# Patient Record
Sex: Male | Born: 1980 | Race: Black or African American | Hispanic: No | Marital: Single | Smoking: Never smoker
Health system: Southern US, Community
[De-identification: ages and names within clinical notes are randomized; demographics above are authoritative.]

## PROBLEM LIST (undated history)

## (undated) DIAGNOSIS — G43909 Migraine, unspecified, not intractable, without status migrainosus: Secondary | ICD-10-CM

## (undated) HISTORY — DX: Migraine, unspecified, not intractable, without status migrainosus: G43.909

## (undated) HISTORY — PX: APPENDECTOMY: SHX54

---

## 2018-12-11 ENCOUNTER — Other Ambulatory Visit: Payer: Self-pay

## 2018-12-11 ENCOUNTER — Emergency Department (HOSPITAL_BASED_OUTPATIENT_CLINIC_OR_DEPARTMENT_OTHER): Payer: 59

## 2018-12-11 ENCOUNTER — Emergency Department (HOSPITAL_BASED_OUTPATIENT_CLINIC_OR_DEPARTMENT_OTHER)
Admission: EM | Admit: 2018-12-11 | Discharge: 2018-12-11 | Disposition: A | Discharge status: Home / Self Care | Payer: 59 | Attending: Emergency Medicine | Admitting: Emergency Medicine

## 2018-12-11 ENCOUNTER — Encounter (HOSPITAL_BASED_OUTPATIENT_CLINIC_OR_DEPARTMENT_OTHER): Payer: Self-pay | Admitting: Emergency Medicine

## 2018-12-11 DIAGNOSIS — R002 Palpitations: Secondary | ICD-10-CM | POA: Insufficient documentation

## 2018-12-11 LAB — CBC WITH DIFFERENTIAL/PLATELET
Abs Immature Granulocytes: 0.01 10*3/uL (ref 0.00–0.07)
Basophils Absolute: 0 10*3/uL (ref 0.0–0.1)
Basophils Relative: 1 %
Eosinophils Absolute: 0.2 10*3/uL (ref 0.0–0.5)
Eosinophils Relative: 4 %
HCT: 48.3 % (ref 39.0–52.0)
Hemoglobin: 15.6 g/dL (ref 13.0–17.0)
Immature Granulocytes: 0 %
Lymphocytes Relative: 41 %
Lymphs Abs: 2.1 10*3/uL (ref 0.7–4.0)
MCH: 29.5 pg (ref 26.0–34.0)
MCHC: 32.3 g/dL (ref 30.0–36.0)
MCV: 91.3 fL (ref 80.0–100.0)
MONO ABS: 0.7 10*3/uL (ref 0.1–1.0)
Monocytes Relative: 13 %
Neutro Abs: 2.1 10*3/uL (ref 1.7–7.7)
Neutrophils Relative %: 41 %
Platelets: 195 10*3/uL (ref 150–400)
RBC: 5.29 MIL/uL (ref 4.22–5.81)
RDW: 12.3 % (ref 11.5–15.5)
WBC: 5.2 10*3/uL (ref 4.0–10.5)
nRBC: 0 % (ref 0.0–0.2)

## 2018-12-11 LAB — URINALYSIS, ROUTINE W REFLEX MICROSCOPIC
Bilirubin Urine: NEGATIVE
GLUCOSE, UA: NEGATIVE mg/dL
Hgb urine dipstick: NEGATIVE
KETONES UR: NEGATIVE mg/dL
Leukocytes, UA: NEGATIVE
Nitrite: NEGATIVE
Protein, ur: NEGATIVE mg/dL
Specific Gravity, Urine: 1.01 (ref 1.005–1.030)
pH: 7.5 (ref 5.0–8.0)

## 2018-12-11 LAB — COMPREHENSIVE METABOLIC PANEL
ALK PHOS: 91 U/L (ref 38–126)
ALT: 37 U/L (ref 0–44)
AST: 29 U/L (ref 15–41)
Albumin: 4.1 g/dL (ref 3.5–5.0)
Anion gap: 9 (ref 5–15)
BUN: 14 mg/dL (ref 6–20)
CO2: 26 mmol/L (ref 22–32)
Calcium: 9.2 mg/dL (ref 8.9–10.3)
Chloride: 102 mmol/L (ref 98–111)
Creatinine, Ser: 1.41 mg/dL — ABNORMAL HIGH (ref 0.61–1.24)
GFR calc Af Amer: >60 mL/min (ref >60)
GFR calc non Af Amer: >60 mL/min (ref >60)
Glucose, Bld: 120 mg/dL — ABNORMAL HIGH (ref 70–99)
Potassium: 3.9 mmol/L (ref 3.5–5.1)
Sodium: 137 mmol/L (ref 135–145)
Total Bilirubin: 0.5 mg/dL (ref 0.3–1.2)
Total Protein: 7.6 g/dL (ref 6.5–8.1)

## 2018-12-11 LAB — TSH: TSH: 2.349 u[IU]/mL (ref 0.350–4.500)

## 2018-12-11 LAB — RAPID URINE DRUG SCREEN, HOSP PERFORMED
Amphetamines: NOT DETECTED
Barbiturates: NOT DETECTED
Benzodiazepines: NOT DETECTED
Cocaine: NOT DETECTED
Opiates: NOT DETECTED
Tetrahydrocannabinol: NOT DETECTED

## 2018-12-11 LAB — TROPONIN I: Troponin I: <0.03 ng/mL (ref <0.03)

## 2018-12-11 MED ORDER — SODIUM CHLORIDE 0.9 % IV BOLUS
1000.0000 mL | Freq: Once | INTRAVENOUS | Status: AC
  Administered 2018-12-11: 1000 mL via INTRAVENOUS

## 2018-12-11 NOTE — ED Notes (Signed)
Patient transported to X-ray 

## 2018-12-11 NOTE — ED Provider Notes (Signed)
[facility] EMERGENCY DEPARTMENT Provider Note   CSN: [id number] Arrival date & time: 12/11/18  1946     History   Chief Complaint Chief Complaint  Patient presents with  . Palpitations    HPI Shaun Stephens is a 37 y.o. male.  Pt presents to the ED today with palpitations.  Pt said sx started today.  He has never had anything like this in the past.  He said he will occasionally feel like his heart will speed up and then pause.  At one point, he felt a sense like something bad was going to happen to him.  He called his friend to drive him here because he was afraid to drive.  He did drink at a party last night, but he does not feel like he drank too much.  He did have an energy drink today, but was having sx prior to this energy drink.  He has had those drinks in the past with no problems.  He is also seeing a twitching in his chest which looks like his heart beat.  He has been under a lot of stress.     History reviewed. No pertinent past medical history.  There are no active problems to display for this patient.   Past Surgical History:  Procedure Laterality Date  . APPENDECTOMY          Home Medications    Prior to Admission medications   Not on File    Family History History reviewed. No pertinent family history.  Social History Social History   Tobacco Use  . Smoking status: Never Smoker  . Smokeless tobacco: Never Used  Substance Use Topics  . Alcohol use: Yes    Frequency: Never    Comment: 1 time per week  . Drug use: Never     Allergies   Patient has no known allergies.   Review of Systems Review of Systems  Cardiovascular: Positive for palpitations.  All other systems reviewed and are negative.    Physical Exam Updated Vital Signs BP 127/71 (BP Location: Right Arm)   Pulse 88   Temp 98.5 F (36.9 C)   Resp 17   Ht 6\' 1"  (1.854 m)   Wt 83.9 kg   SpO2 98%   BMI 24.41 kg/m   Physical Exam Vitals signs and nursing  note reviewed.  Constitutional:      Appearance: Normal appearance. He is normal weight.  HENT:     Head: Normocephalic and atraumatic.     Right Ear: External ear normal.     Left Ear: External ear normal.     Nose: Nose normal.     Mouth/Throat:     Mouth: Mucous membranes are moist.     Pharynx: Oropharynx is clear.  Eyes:     Extraocular Movements: Extraocular movements intact.     Conjunctiva/sclera: Conjunctivae normal.     Pupils: Pupils are equal, round, and reactive to light.  Neck:     Musculoskeletal: Normal range of motion and neck supple.  Cardiovascular:     Rate and Rhythm: Normal rate and regular rhythm.     Pulses: Normal pulses.     Heart sounds: Normal heart sounds.     Comments: pmi is prominent on chest Pulmonary:     Effort: Pulmonary effort is normal.     Breath sounds: Normal breath sounds.  Abdominal:     General: Abdomen is flat. Bowel sounds are normal.  Musculoskeletal: Normal range of motion.  Skin:    General: Skin is warm and dry.     Capillary Refill: Capillary refill takes less than 2 seconds.  Neurological:     General: No focal deficit present.     Mental Status: He is alert and oriented to person, place, and time.  Psychiatric:        Mood and Affect: Mood normal.        Behavior: Behavior normal.        Thought Content: Thought content normal.        Judgment: Judgment normal.      ED Treatments / Results  Labs (all labs ordered are listed, but only abnormal results are displayed) Labs Reviewed  COMPREHENSIVE METABOLIC PANEL - Abnormal; Notable for the following components:      Result Value   Glucose, Bld 120 (*)    Creatinine, Ser 1.41 (*)    All other components within normal limits  CBC WITH DIFFERENTIAL/PLATELET  TROPONIN I  URINALYSIS, ROUTINE W REFLEX MICROSCOPIC  RAPID URINE DRUG SCREEN, HOSP PERFORMED  TSH    EKG EKG Interpretation  Date/Time:  Saturday December 11 2018 19:56:23 EST Ventricular Rate:  93 PR  Interval:    QRS Duration: 84 QT Interval:  341 QTC Calculation: 425 R Axis:   23 Text Interpretation:  Sinus rhythm Abnormal R-wave progression, early transition ST elevation, consider inferior injury No old tracing to compare Confirmed by Jacalyn LefevreHaviland, Chaunda Vandergriff [phone]([id number]) on 12/11/2018 8:06:03 PM   Radiology Dg Chest 2 View  Result Date: 12/11/2018 CLINICAL DATA:  Palpitation EXAM: CHEST - 2 VIEW COMPARISON:  None. FINDINGS: The heart size and mediastinal contours are within normal limits. Both lungs are clear. The visualized skeletal structures are unremarkable. IMPRESSION: No active cardiopulmonary disease. Electronically Signed   By: Jasmine PangKim  Fujinaga M.D.   On: 12/11/2018 21:01    Procedures Procedures (including critical care time)  Medications Ordered in ED Medications  sodium chloride 0.9 % bolus 1,000 mL (1,000 mLs Intravenous New Bag/Given 12/11/18 2125)     Initial Impression / Assessment and Plan / ED Course  I have reviewed the triage vital signs and the nursing notes.  Pertinent labs & imaging results that were available during my care of the patient were reviewed by me and considered in my medical decision making (see chart for details).    Pt is feeling much better after IVFs.  He has remained in NSR on monitor.  He is instructed to f/u with cardiology.  Avoid caffeine.  Drink lots of water.  Return if worse.  Final Clinical Impressions(s) / ED Diagnoses   Final diagnoses:  Palpitations    ED Discharge Orders    None       Jacalyn LefevreHaviland, Cordarrius Coad, MD 12/11/18 2344

## 2018-12-11 NOTE — ED Triage Notes (Signed)
Patient states that he was at home and felt like his heart sped up and then he had " a bad feeling" come over him . The patient states that this has been intermittent today  - denies any pain

## 2018-12-14 ENCOUNTER — Encounter (HOSPITAL_BASED_OUTPATIENT_CLINIC_OR_DEPARTMENT_OTHER): Payer: Self-pay | Admitting: Adult Health

## 2018-12-14 ENCOUNTER — Emergency Department (HOSPITAL_BASED_OUTPATIENT_CLINIC_OR_DEPARTMENT_OTHER)
Admission: EM | Admit: 2018-12-14 | Discharge: 2018-12-14 | Disposition: A | Discharge status: Home / Self Care | Payer: 59 | Attending: Emergency Medicine | Admitting: Emergency Medicine

## 2018-12-14 ENCOUNTER — Other Ambulatory Visit: Payer: Self-pay

## 2018-12-14 DIAGNOSIS — R0789 Other chest pain: Secondary | ICD-10-CM | POA: Diagnosis not present

## 2018-12-14 DIAGNOSIS — R002 Palpitations: Secondary | ICD-10-CM | POA: Diagnosis not present

## 2018-12-14 LAB — CBC
HCT: 47.8 % (ref 39.0–52.0)
Hemoglobin: 15.7 g/dL (ref 13.0–17.0)
MCH: 29.8 pg (ref 26.0–34.0)
MCHC: 32.8 g/dL (ref 30.0–36.0)
MCV: 90.9 fL (ref 80.0–100.0)
Platelets: 216 10*3/uL (ref 150–400)
RBC: 5.26 MIL/uL (ref 4.22–5.81)
RDW: 12 % (ref 11.5–15.5)
WBC: 6.1 10*3/uL (ref 4.0–10.5)
nRBC: 0 % (ref 0.0–0.2)

## 2018-12-14 LAB — BASIC METABOLIC PANEL
Anion gap: 8 (ref 5–15)
BUN: 12 mg/dL (ref 6–20)
CO2: 26 mmol/L (ref 22–32)
Calcium: 9.5 mg/dL (ref 8.9–10.3)
Chloride: 102 mmol/L (ref 98–111)
Creatinine, Ser: 1.49 mg/dL — ABNORMAL HIGH (ref 0.61–1.24)
GFR calc Af Amer: >60 mL/min (ref >60)
GFR calc non Af Amer: 58 mL/min — ABNORMAL LOW (ref >60)
Glucose, Bld: 103 mg/dL — ABNORMAL HIGH (ref 70–99)
POTASSIUM: 3.9 mmol/L (ref 3.5–5.1)
Sodium: 136 mmol/L (ref 135–145)

## 2018-12-14 LAB — TROPONIN I: Troponin I: <0.03 ng/mL (ref <0.03)

## 2018-12-14 NOTE — ED Triage Notes (Signed)
PResents with chest pain , chest warmth and feeling like "I can't quite describe it but I feels like the lights will go out on me or something bad is going to happen. Like I am going to blak out. It really hard to explain but it has been coming in cycles sine I have been here last Saturday. My chest gets very warm and I feel like my heart is skipping beats and I have to take deep breaths. I am so scared I am having aheart attack or something really bad. My child just diagnosed with an auotimmune disease and she has been going back and forth to docotrs and there is a lot of stress. I just don't know what is happening to me and I am so worried"

## 2018-12-14 NOTE — ED Notes (Signed)
ED Provider at bedside. 

## 2018-12-15 DIAGNOSIS — R079 Chest pain, unspecified: Secondary | ICD-10-CM | POA: Insufficient documentation

## 2018-12-15 DIAGNOSIS — R002 Palpitations: Secondary | ICD-10-CM | POA: Insufficient documentation

## 2018-12-15 NOTE — ED Provider Notes (Signed)
[facility] EMERGENCY DEPARTMENT Provider Note   CSN: [id number] Arrival date & time: 12/14/18  1911     History   Chief Complaint Chief Complaint  Patient presents with  . Chest Pain    HPI Shaun Stephens is a 37 y.o. male.  Patient is a healthy 37 year old male presenting today with recurrent chest hotness worse with laying down and ongoing intermittent palpitations.  Patient was evaluated 2 days ago for similar symptoms and at that time had normal blood work, x-ray and EKG but states because his symptoms persisted he was just worried and wanted to be rechecked.  He states he is not been drinking any more caffeine than normal but has been under a lot of stress since November when he found out his son has an autoimmune disease.  He does feel stressed and anxious more constantly and is not sleeping well.  No drug, alcohol or tobacco use.  No early death from heart disease in his family.  Patient also states that he is set up a ointment with cardiology for Thursday.  The history is provided by the patient.  Chest Pain   This is a recurrent problem. The current episode started 2 days ago. The problem occurs hourly. The problem has been gradually worsening. Associated with: spontaneously. The pain is present in the substernal region. Pain scale: chest just feels hot and having palpitations. The patient is experiencing no pain. The pain does not radiate. Associated symptoms include irregular heartbeat and palpitations. Pertinent negatives include no abdominal pain, no diaphoresis, no dizziness, no nausea, no shortness of breath, no syncope, no vomiting and no weakness. He has tried rest for the symptoms. The treatment provided no relief. Risk factors include stress.  Pertinent negatives for past medical history include no CAD and no diabetes.  Pertinent negatives for family medical history include: no CAD and no PE.    History reviewed. No pertinent past medical history.  There  are no active problems to display for this patient.   Past Surgical History:  Procedure Laterality Date  . APPENDECTOMY          Home Medications    Prior to Admission medications   Not on File    Family History History reviewed. No pertinent family history.  Social History Social History   Tobacco Use  . Smoking status: Never Smoker  . Smokeless tobacco: Never Used  Substance Use Topics  . Alcohol use: Yes    Frequency: Never    Comment: 1 time per week  . Drug use: Never     Allergies   Patient has no known allergies.   Review of Systems Review of Systems  Constitutional: Negative for diaphoresis.  Respiratory: Negative for shortness of breath.   Cardiovascular: Positive for chest pain and palpitations. Negative for syncope.  Gastrointestinal: Negative for abdominal pain, nausea and vomiting.  Neurological: Negative for dizziness and weakness.  All other systems reviewed and are negative.    Physical Exam Updated Vital Signs BP 135/85   Pulse 80   Temp 98.3 F (36.8 C)   Resp 18   Ht 6\' 1"  (1.854 m)   Wt 83.9 kg   SpO2 99%   BMI 24.41 kg/m   Physical Exam Vitals signs and nursing note reviewed.  Constitutional:      General: He is not in acute distress.    Appearance: He is well-developed.     Comments: Slightly anxious on exam  HENT:     Head: Normocephalic  and atraumatic.  Eyes:     Conjunctiva/sclera: Conjunctivae normal.     Pupils: Pupils are equal, round, and reactive to light.  Neck:     Musculoskeletal: Normal range of motion and neck supple.  Cardiovascular:     Rate and Rhythm: Normal rate and regular rhythm.     Pulses: Normal pulses.     Heart sounds: No murmur.  Pulmonary:     Effort: Pulmonary effort is normal. No respiratory distress.     Breath sounds: Normal breath sounds. No wheezing or rales.  Abdominal:     General: There is no distension.     Palpations: Abdomen is soft.     Tenderness: There is no abdominal  tenderness. There is no guarding or rebound.  Musculoskeletal: Normal range of motion.        General: No tenderness.  Skin:    General: Skin is warm and dry.     Capillary Refill: Capillary refill takes less than 2 seconds.     Findings: No erythema or rash.  Neurological:     General: No focal deficit present.     Mental Status: He is alert and oriented to person, place, and time. Mental status is at baseline.  Psychiatric:        Mood and Affect: Mood normal.        Behavior: Behavior normal.      ED Treatments / Results  Labs (all labs ordered are listed, but only abnormal results are displayed) Labs Reviewed  BASIC METABOLIC PANEL - Abnormal; Notable for the following components:      Result Value   Glucose, Bld 103 (*)    Creatinine, Ser 1.49 (*)    GFR calc non Af Amer 58 (*)    All other components within normal limits  CBC  TROPONIN I    EKG EKG Interpretation  Date/Time:  Tuesday December 14 2018 19:19:35 EST Ventricular Rate:  71 PR Interval:  160 QRS Duration: 68 QT Interval:  360 QTC Calculation: 391 R Axis:   67 Text Interpretation:  Normal sinus rhythm with sinus arrhythmia Normal ECG Confirmed by Gwyneth SproutPlunkett, Marcos Peloso ([id number]) on 12/14/2018 8:47:57 PM   Radiology No results found.  Procedures Procedures (including critical care time)  Medications Ordered in ED Medications - No data to display   Initial Impression / Assessment and Plan / ED Course  I have reviewed the triage vital signs and the nursing notes.  Pertinent labs & imaging results that were available during my care of the patient were reviewed by me and considered in my medical decision making (see chart for details).     Patient presenting today with ongoing symptoms which she was seen for 2 days ago.  Patient had normal blood work and TSH as well as EKG and chest x-ray done 2 days ago.  His symptoms have persisted and he feels they are little bit worse.  Patient has normal vital  signs on exam but is mildly anxious appearing.  He has a normal EKG and had blood work drawn prior to being bedded which is still unchanged.  Discussed with patient concerned that symptoms may be stress related as he has been under more stress since his son was diagnosed with an autoimmune disease.  He has follow-up with cardiology on Thursday.  Also discussed with him if he is getting more heartburn-like symptoms he may improve by taking antacids but symptoms are not exertional otherwise given he is a healthy male does not use  drugs and has no known medical problems feel he is safe for discharge and follow-up with cardiology as planned.  Final Clinical Impressions(s) / ED Diagnoses   Final diagnoses:  Atypical chest pain  Palpitations    ED Discharge Orders    None       Gwyneth Sprout, MD 12/15/18 [phone]

## 2018-12-15 NOTE — Progress Notes (Signed)
Cardiology Office Note:    Date:  12/16/2018   ID:  Shaun Stephens, DOB 03/21/1981, MRN [id number]  PCP:  Patient, No Pcp Per  Cardiologist:  Norman HerrlichBrian Munley, MD   Referring MD: No ref. provider found  ASSESSMENT:    1. Palpitation   2. Chest pain in adult    PLAN:    In order of problems listed above:  1. Clinically I suspected SVT induced by an energy drink prior to the first ED visit the other episodes are quite mild and seem to be influenced by anxiety.  For reassurance that he is a 14-day extended ZIO monitor and echocardiogram to reassure that he does not have underlying structural heart disease his EKG has an early repolarization variant of normal. 2. Symptoms are very atypical nonanginal sound musculoskeletal with neck and shoulder pain nonexertional and at this time I would not pursue an ischemia evaluation  Next appointment 6 weeks   Medication Adjustments/Labs and Tests Ordered: Current medicines are reviewed at length with the patient today.  Concerns regarding medicines are outlined above.  No orders of the defined types were placed in this encounter.  No orders of the defined types were placed in this encounter.    Chief Complaint  Patient presents with  . Hospitalization Follow-up    pain in neck and shoulder. Heart Flutter     History of Present Illness:    Shaun RoyalsKelvin Stephens is a 37 y.o. male who is being seen today for the evaluation of palpitation and chest pain with 2 recent [facility] ED visits at the request of No ref. provider found. His first emergency room visit 12/11/2018 was for palpitation.  He was in sinus rhythm and he had no abnormality documented on his EKG or telemetry.  Subsequently seen again 12/14/2018 with similar symptoms and again no arrhythmia noted in the emergency room normal testing.  The day of the first ED visit he had an energy drink and afterwards his heart was racing quite rapidly unfortunately stopped prior to the emergency room visit.  He  was seen but not quite reassured and since then he has had symptoms were intermittently is aware of his heart fluttering and he feels a warm sensation in his chest is caused him a great deal of anxiety and is heightened his insecurity has a 39 year old son is ill with ulcerative colitis.  He has no exertional chest pain shortness of breath syncope no previous episodes of palpitation.  He takes no over-the-counter proarrhythmic drugs and his EKGs are normal with a pattern of early repolarization.  He has had no fever or chills shortness of breath or symptoms of upper respiratory infection or pericarditis.  He has no history of congenital or rheumatic heart disease  Past Medical History:  Diagnosis Date  . Migraines    Dx 2005    Past Surgical History:  Procedure Laterality Date  . APPENDECTOMY      Current Medications: No outpatient medications have been marked as taking for the 12/16/18 encounter (Office Visit) with Baldo DaubMunley, Brian J, MD.     Allergies:   Patient has no known allergies.   Social History   Socioeconomic History  . Marital status: Single    Spouse name: Not on file  . Number of children: Not on file  . Years of education: Not on file  . Highest education level: Not on file  Occupational History  . Not on file  Social Needs  . Financial resource strain: Not on file  .  Food insecurity:    Worry: Not on file    Inability: Not on file  . Transportation needs:    Medical: Not on file    Non-medical: Not on file  Tobacco Use  . Smoking status: Never Smoker  . Smokeless tobacco: Never Used  Substance and Sexual Activity  . Alcohol use: Yes    Frequency: Never    Comment: 1 time per week  . Drug use: Never  . Sexual activity: Not on file  Lifestyle  . Physical activity:    Days per week: Not on file    Minutes per session: Not on file  . Stress: Not on file  Relationships  . Social connections:    Talks on phone: Not on file    Gets together: Not on file     Attends religious service: Not on file    Active member of club or organization: Not on file    Attends meetings of clubs or organizations: Not on file    Relationship status: Not on file  Other Topics Concern  . Not on file  Social History Narrative  . Not on file     Family History: The patient's family history includes Breast cancer in his mother; Heart failure in his father; Kidney disease in his father; Stroke in his mother.  ROS:   Review of Systems  Constitution: Positive for chills (dya of ED visit).  HENT: Negative.   Eyes: Negative.   Cardiovascular: Positive for palpitations.  Respiratory: Negative.   Endocrine: Negative.   Hematologic/Lymphatic: Negative.   Skin: Negative.   Musculoskeletal: Positive for joint pain (shoulder) and neck pain.  Gastrointestinal: Negative.   Genitourinary: Negative.   Neurological: Negative.   Psychiatric/Behavioral: Negative.   Allergic/Immunologic: Negative.    Please see the history of present illness.     All other systems reviewed and are negative.  EKGs/Labs/Other Studies Reviewed:    The following studies were reviewed today:   EKG: From his 2 ED visits independently reviewed with the patient  Recent Labs: 12/11/2018: ALT 37; TSH 2.349 12/14/2018: BUN 12; Creatinine, Ser 1.49; Hemoglobin 15.7; Platelets 216; Potassium 3.9; Sodium 136  Recent Lipid Panel No results found for: CHOL, TRIG, HDL, CHOLHDL, VLDL, LDLCALC, LDLDIRECT  Physical Exam:    VS:  There were no vitals taken for this visit.    Wt Readings from Last 3 Encounters:  12/14/18 185 lb (83.9 kg)  12/11/18 185 lb (83.9 kg)     GEN:  Well nourished, well developed in no acute distress HEENT: Normal NECK: No JVD; No carotid bruits LYMPHATICS: No lymphadenopathy CARDIAC: RRR, no murmurs, rubs, gallops RESPIRATORY:  Clear to auscultation without rales, wheezing or rhonchi  ABDOMEN: Soft, non-tender, non-distended MUSCULOSKELETAL:  No edema; No deformity   SKIN: Warm and dry NEUROLOGIC:  Alert and oriented x 3 PSYCHIATRIC:  Normal affect     Signed, Norman HerrlichBrian Munley, MD  12/16/2018 8:17 AM    [facility]

## 2018-12-16 ENCOUNTER — Ambulatory Visit (INDEPENDENT_AMBULATORY_CARE_PROVIDER_SITE_OTHER): Payer: 59 | Admitting: Cardiology

## 2018-12-16 ENCOUNTER — Encounter: Payer: Self-pay | Admitting: Cardiology

## 2018-12-16 DIAGNOSIS — R079 Chest pain, unspecified: Secondary | ICD-10-CM | POA: Diagnosis not present

## 2018-12-16 DIAGNOSIS — R002 Palpitations: Secondary | ICD-10-CM | POA: Diagnosis not present

## 2018-12-16 NOTE — Patient Instructions (Addendum)
Medication Instructions:  Your physician recommends that you continue on your current medications as directed. Please refer to the Current Medication list given to you today.  If you need a refill on your cardiac medications before your next appointment, please call your pharmacy.   Lab work: None  If you have labs (blood work) drawn today and your tests are completely normal, you will receive your results only by: Marland Kitchen. MyChart Message (if you have MyChart) OR . A paper copy in the mail If you have any lab test that is abnormal or we need to change your treatment, we will call you to review the results.  Testing/Procedures: Your physician has requested that you have an echocardiogram. Echocardiography is a painless test that uses sound waves to create images of your heart. It provides your doctor with information about the size and shape of your heart and how well your heart's chambers and valves are working. This procedure takes approximately one hour. There are no restrictions for this procedure.  Your physician has recommended that you wear a ZIO patch monitor. ZIO patch monitors are medical devices that record the heart's electrical activity. Doctors most often use these monitors to diagnose arrhythmias. Arrhythmias are problems with the speed or rhythm of the heartbeat. The monitor is a small, portable device. You can wear one while you do your normal daily activities. This is usually used to diagnose what is causing palpitations/syncope (passing out). Wear for 14 days.   Follow-Up: At [facility], you and your health needs are our priority.  As part of our continuing mission to provide you with exceptional heart care, we have created designated Provider Care Teams.  These Care Teams include your primary Cardiologist (physician) and Advanced Practice Providers (APPs -  Physician Assistants and Nurse Practitioners) who all work together to provide you with the care you need, when you need  it. You will need a follow up appointment in 6 weeks.        1. Avoid all over-the-counter antihistamines except Claritin/Loratadine and Zyrtec/Cetrizine. 2. Avoid all combination including cold sinus allergies flu decongestant and sleep medications 3. You can use Robitussin DM Mucinex and Mucinex DM for cough. 4. can use Tylenol aspirin ibuprofen and naproxen but no combinations such as sleep or sinus.    Echocardiogram An echocardiogram is a procedure that uses painless sound waves (ultrasound) to produce an image of the heart. Images from an echocardiogram can provide important information about:  Signs of coronary artery disease (CAD).  Aneurysm detection. An aneurysm is a weak or damaged part of an artery wall that bulges out from the normal force of blood pumping through the body.  Heart size and shape. Changes in the size or shape of the heart can be associated with certain conditions, including heart failure, aneurysm, and CAD.  Heart muscle function.  Heart valve function.  Signs of a past heart attack.  Fluid buildup around the heart.  Thickening of the heart muscle.  A tumor or infectious growth around the heart valves. Tell a health care provider about:  Any allergies you have.  All medicines you are taking, including vitamins, herbs, eye drops, creams, and over-the-counter medicines.  Any blood disorders you have.  Any surgeries you have had.  Any medical conditions you have.  Whether you are pregnant or may be pregnant. What are the risks? Generally, this is a safe procedure. However, problems may occur, including:  Allergic reaction to dye (contrast) that may be used during the  procedure. What happens before the procedure? No specific preparation is needed. You may eat and drink normally. What happens during the procedure?   An IV tube may be inserted into one of your veins.  You may receive contrast through this tube. A contrast is an  injection that improves the quality of the pictures from your heart.  A gel will be applied to your chest.  A wand-like tool (transducer) will be moved over your chest. The gel will help to transmit the sound waves from the transducer.  The sound waves will harmlessly bounce off of your heart to allow the heart images to be captured in real-time motion. The images will be recorded on a computer. The procedure may vary among health care providers and hospitals. What happens after the procedure?  You may return to your normal, everyday life, including diet, activities, and medicines, unless your health care provider tells you not to do that. Summary  An echocardiogram is a procedure that uses painless sound waves (ultrasound) to produce an image of the heart.  Images from an echocardiogram can provide important information about the size and shape of your heart, heart muscle function, heart valve function, and fluid buildup around your heart.  You do not need to do anything to prepare before this procedure. You may eat and drink normally.  After the echocardiogram is completed, you may return to your normal, everyday life, unless your health care provider tells you not to do that. This information is not intended to replace advice given to you by your health care provider. Make sure you discuss any questions you have with your health care provider. Document Released: 12/05/2000 Document Revised: 01/10/2017 Document Reviewed: 01/10/2017 Elsevier Interactive Patient Education  2019 [employer]

## 2018-12-20 ENCOUNTER — Ambulatory Visit (HOSPITAL_BASED_OUTPATIENT_CLINIC_OR_DEPARTMENT_OTHER)
Admission: RE | Admit: 2018-12-20 | Discharge: 2018-12-20 | Disposition: A | Discharge status: Home / Self Care | Payer: 59 | Source: Ambulatory Visit | Attending: Cardiology | Admitting: Cardiology

## 2018-12-20 DIAGNOSIS — R079 Chest pain, unspecified: Secondary | ICD-10-CM | POA: Insufficient documentation

## 2018-12-20 DIAGNOSIS — R002 Palpitations: Secondary | ICD-10-CM | POA: Diagnosis present

## 2018-12-20 NOTE — Progress Notes (Signed)
  Echocardiogram 2D Echocardiogram has been performed.  Marycruz Boehner T Shawn Carattini 12/20/2018, 11:16 AM

## 2019-01-13 ENCOUNTER — Ambulatory Visit (INDEPENDENT_AMBULATORY_CARE_PROVIDER_SITE_OTHER): Payer: 59

## 2019-01-13 DIAGNOSIS — R002 Palpitations: Secondary | ICD-10-CM | POA: Diagnosis not present

## 2019-02-02 NOTE — Progress Notes (Signed)
Cardiology Office Note:    Date:  02/03/2019   ID:  Shaun Stephens, DOB March 30, 1981, MRN [id number]  PCP:  Patient, No Pcp Per  Cardiologist:  Norman Herrlich, MD    Referring MD: No ref. provider found    ASSESSMENT:    No diagnosis found. PLAN:    In order of problems listed above:  1. 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  He is reassured by his normal echocardiogram and is resumed his regular exercise program   Next appointment: As needed   Medication Adjustments/Labs and Tests Ordered: Current medicines are reviewed at length with the patient today.  Concerns regarding medicines are outlined above.  No orders of the defined types were placed in this encounter.  No orders of the defined types were placed in this encounter.   Chief Complaint  Patient presents with  . Follow-up    after testing    History of Present Illness:    Shaun Stephens is a 38 y.o. male with a hx of palpitation with several ED visits last seen 12/16/18. An echocardiogram 12/20/2018 showed an  ejection fraction 55 to 60% normal.  Long-term monitor report is pending-incomplete. Compliance with diet, lifestyle and medications: yes  Is back to his exercise program has had no palpitation and feels more comfortable with a normal echocardiogram.  Fortunately his son is improved and there is less stress in his life.  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 Migraines    Dx  2005    Past Surgical History:  Procedure Laterality Date  . APPENDECTOMY      Current Medications: No outpatient medications have been marked as taking for the 02/03/19 encounter (Office Visit) with Shaun Daub, MD.     Allergies:   Patient has no known allergies.   Social History   Socioeconomic History  . Marital status: Single    Spouse name: Not on file  . Number of children: Not on file  . Years of education: Not on file  . Highest education level: Not on file  Occupational History  . Not on file  Social Needs  . Financial resource strain: Not on file  . Food insecurity:    Worry: Not on file    Inability: Not on file  . Transportation needs:    Medical: Not on file    Non-medical: Not on file  Tobacco Use  . Smoking status: Never Smoker  . Smokeless tobacco: Never Used  Substance and Sexual Activity  . Alcohol use: Yes    Frequency: Never    Comment: 1 time per week  . Drug use: Never  . Sexual activity: Not on file  Lifestyle  . Physical activity:    Days per week: Not on file    Minutes per session: Not on file  . Stress: Not on file  Relationships  . Social connections:    Talks on phone: Not on  file    Gets together: Not on file    Attends religious service: Not on file    Active member of club or organization: Not on file    Attends meetings of clubs or organizations: Not on file    Relationship status: Not on file  Other Topics Concern  . Not on file  Social History Narrative  . Not on file     Family History: The patient's family history includes Breast cancer in his mother; Heart failure in his father; Kidney disease in his father; Stroke in his mother. ROS:   Please see the history of present illness.    All other systems reviewed and are negative.  EKGs/Labs/Other Studies Reviewed:    The following studies were reviewed today:  EKG:  EKG ordered today.  The ekg ordered today demonstrates SRTH early repolarization  Recent  Labs: 12/11/2018: ALT 37; TSH 2.349 12/14/2018: BUN 12; Creatinine, Ser 1.49; Hemoglobin 15.7; Platelets 216; Potassium 3.9; Sodium 136  Recent Lipid Panel No results found for: CHOL, TRIG, HDL, CHOLHDL, VLDL, LDLCALC, LDLDIRECT  Physical Exam:    VS:  BP (!) 122/58 (BP Location: Left Arm, Patient Position: Sitting, Cuff Size: Large)   Pulse 80   Ht 6\' 1"  (1.854 m)   Wt 185 lb 12.8 oz (84.3 kg)   SpO2 97%   BMI 24.51 kg/m     Wt Readings from Last 3 Encounters:  02/03/19 185 lb 12.8 oz (84.3 kg)  12/16/18 186 lb 1.9 oz (84.4 kg)  12/14/18 185 lb (83.9 kg)     GEN:  Well nourished, well developed in no acute distress HEENT: Normal NECK: No JVD; No carotid bruits LYMPHATICS: No lymphadenopathy CARDIAC: RRR, no murmurs, rubs, gallops RESPIRATORY:  Clear to auscultation without rales, wheezing or rhonchi  ABDOMEN: Soft, non-tender, non-distended MUSCULOSKELETAL:  No edema; No deformity  SKIN: Warm and dry NEUROLOGIC:  Alert and oriented x 3 PSYCHIATRIC:  Normal affect    Signed, Norman Herrlich, MD  02/03/2019 8:32 AM    [facility]

## 2019-02-03 ENCOUNTER — Ambulatory Visit (INDEPENDENT_AMBULATORY_CARE_PROVIDER_SITE_OTHER): Payer: 59 | Admitting: Cardiology

## 2019-02-03 ENCOUNTER — Encounter: Payer: Self-pay | Admitting: Cardiology

## 2019-02-03 VITALS — BP 122/58 | HR 80 | Ht 73.0 in | Wt 185.8 lb

## 2019-02-03 DIAGNOSIS — R002 Palpitations: Secondary | ICD-10-CM

## 2019-02-03 MED ORDER — METOPROLOL TARTRATE 25 MG PO TABS: 25.0000 mg | ORAL_TABLET | Freq: Four times a day (QID) | ORAL | 6 refills | Status: AC | PRN

## 2019-02-03 NOTE — Patient Instructions (Addendum)
Medication Instructions:  Your physician has recommended you make the following change in your medication:   START taking lopressor 25 mg (1 tablet) as needed every 6 hours for heart rate 130 or greater.  If you need a refill on your cardiac medications before your next appointment, please call your pharmacy.   Lab work: NONE  Testing/Procedures: An EKG was performed today.  Follow-Up: At [facility], you and your health needs are our priority.  As part of our continuing mission to provide you with exceptional heart care, we have created designated Provider Care Teams.  These Care Teams include your primary Cardiologist (physician) and Advanced Practice Providers (APPs -  Physician Assistants and Nurse Practitioners) who all work together to provide you with the care you need, when you need it. Please follow up with Dr. Dulce SellarMunley if symptoms persist or worsen.       1. Avoid all over-the-counter antihistamines except Claritin/Loratadine and Zyrtec/Cetrizine. 2. Avoid all combination including cold sinus allergies flu decongestant and sleep medications 3. You can use Robitussin DM Mucinex and Mucinex DM for cough. 4. can use Tylenol aspirin ibuprofen and naproxen but no combinations such as sleep or sinus.   Metoprolol tablets What is this medicine? METOPROLOL (me TOE proe lole) is a beta-blocker. Beta-blockers reduce the workload on the heart and help it to beat more regularly. This medicine is used to treat high blood pressure and to prevent chest pain. It is also used to after a heart attack and to prevent an additional heart attack from occurring. This medicine may be used for other purposes; ask your health care provider or pharmacist if you have questions. COMMON BRAND NAME(S): Lopressor What should I tell my health care provider before I take this medicine? They need to know if you have any of these conditions: -diabetes -heart or vessel disease like slow heart rate,  worsening heart failure, heart block, sick sinus syndrome or Raynaud's disease -kidney disease -liver disease -lung or breathing disease, like asthma or emphysema -pheochromocytoma -thyroid disease -an unusual or allergic reaction to metoprolol, other beta-blockers, medicines, foods, dyes, or preservatives -pregnant or trying to get pregnant -breast-feeding How should I use this medicine? Take this medicine by mouth with a drink of water. Follow the directions on the prescription label. Take this medicine immediately after meals. Take your doses at regular intervals. Do not take more medicine than directed. Do not stop taking this medicine suddenly. This could lead to serious heart-related effects. Talk to your pediatrician regarding the use of this medicine in children. Special care may be needed. Overdosage: If you think you have taken too much of this medicine contact a poison control center or emergency room at once. NOTE: This medicine is only for you. Do not share this medicine with others. What if I miss a dose? If you miss a dose, take it as soon as you can. If it is almost time for your next dose, take only that dose. Do not take double or extra doses. What may interact with this medicine? This medicine may interact with the following medications: -certain medicines for blood pressure, heart disease, irregular heart beat -certain medicines for depression like monoamine oxidase (MAO) inhibitors, fluoxetine, or paroxetine -clonidine -dobutamine -epinephrine -isoproterenol -reserpine This list may not describe all possible interactions. Give your health care provider a list of all the medicines, herbs, non-prescription drugs, or dietary supplements you use. Also tell them if you smoke, drink alcohol, or use illegal drugs. Some items may interact  with your medicine. What should I watch for while using this medicine? Visit your doctor or health care professional for regular check ups.  Contact your doctor right away if your symptoms worsen. Check your blood pressure and pulse rate regularly. Ask your health care professional what your blood pressure and pulse rate should be, and when you should contact them. You may get drowsy or dizzy. Do not drive, use machinery, or do anything that needs mental alertness until you know how this medicine affects you. Do not sit or stand up quickly, especially if you are an older patient. This reduces the risk of dizzy or fainting spells. Contact your doctor if these symptoms continue. Alcohol may interfere with the effect of this medicine. Avoid alcoholic drinks. What side effects may I notice from receiving this medicine? Side effects that you should report to your doctor or health care professional as soon as possible: -allergic reactions like skin rash, itching or hives -cold or numb hands or feet -depression -difficulty breathing -faint -fever with sore throat -irregular heartbeat, chest pain -rapid weight gain -swollen legs or ankles Side effects that usually do not require medical attention (report to your doctor or health care professional if they continue or are bothersome): -anxiety or nervousness -change in sex drive or performance -dry skin -headache -nightmares or trouble sleeping -short term memory loss -stomach upset or diarrhea -unusually tired This list may not describe all possible side effects. Call your doctor for medical advice about side effects. You may report side effects to FDA at 1-800-FDA-1088. Where should I keep my medicine? Keep out of the reach of children. Store at room temperature between 15 and 30 degrees C (59 and 86 degrees F). Throw away any unused medicine after the expiration date. NOTE: This sheet is a summary. It may not cover all possible information. If you have questions about this medicine, talk to your doctor, pharmacist, or health care provider.  2019 Elsevier/Gold Standard (2013-08-12  14:40:36)

## 2019-11-14 ENCOUNTER — Encounter: Payer: Self-pay | Admitting: Family

## 2019-11-14 ENCOUNTER — Ambulatory Visit (INDEPENDENT_AMBULATORY_CARE_PROVIDER_SITE_OTHER): Payer: 59 | Admitting: Family

## 2019-11-14 ENCOUNTER — Telehealth: Payer: Self-pay | Admitting: Cardiology

## 2019-11-14 ENCOUNTER — Other Ambulatory Visit: Payer: Self-pay

## 2019-11-14 VITALS — BP 118/70 | HR 70 | Resp 18 | Ht 73.0 in | Wt 179.2 lb

## 2019-11-14 DIAGNOSIS — R002 Palpitations: Secondary | ICD-10-CM

## 2019-11-14 DIAGNOSIS — I493 Ventricular premature depolarization: Secondary | ICD-10-CM | POA: Diagnosis not present

## 2019-11-14 DIAGNOSIS — I491 Atrial premature depolarization: Secondary | ICD-10-CM

## 2019-11-14 NOTE — Progress Notes (Signed)
Office Visit    Patient Name: Shaun Stephens Date of Encounter: 11/14/2019  Primary Care Provider:  Rosemary Holms Primary Cardiologist:  Norman Herrlich, MD Electrophysiologist:  None   Chief Complaint    Shaun Stephens is a 38 y.o. male with a hx of palpitations, PVC presents today for palpitations  Past Medical History    Past Medical History:  Diagnosis Date  . Migraines    Dx 2005   Past Surgical History:  Procedure Laterality Date  . APPENDECTOMY      Allergies  No Known Allergies  History of Present Illness    Shaun Stephens is a 38 y.o. male with a hx of palpitations, PVC last seen by Dr. Dulce Sellar 02/03/2019.  He was previously evaluated for palpitations.  Echo 11/2018 with normal LVEF, no significant valvular abnormalities.  Long-term monitor 12/2018 with rare PVC, PAC which were symptomatic.  He was started on as needed metoprolol.  Presents today for palpitations.  Tells me they feel like forceful heartbeats and he feels he can see his heart beating out of his chest.  Started Saturday morning.  They have occurred intermittently both at rest and with activity.  Not associated with shortness of breath nor with pain.  He is very worried this is mom has a history of stroke and he is also concerned about heart attack.  Reassurance provided the palpitations are not a sign of heart attack more of a stroke.  He has no symptoms concerning for heart attack with no chest pain, DOE.  Prior to this weekend was exercising regularly at the gym.  Has taken a break as he was worried about his heart.  Return to exercise was encouraged.  He drinks alcohol socially.  He does not smoke.  Takes no proarrhythmic medications.  Does report increased stress recently.  He has not taken his as needed metoprolol as he was uncertain when he was supposed to take it.  EKGs/Labs/Other Studies Reviewed:   The following studies were reviewed today: Echo 11/2018 Left ventricle: The cavity  size was normal. Wall thickness was   increased in a pattern of mild LVH. Systolic function was normal.   The estimated ejection fraction was in the range of 55% to 60%.   Wall motion was normal; there were no regional wall motion   abnormalities. Left ventricular diastolic function parameters   were normal.  Long-term monitor 01/13/2019 A ZIO monitor was performed for 14 days in order to assess palpitation.  The rhythm throughout is sinus.  Minimum average and maximum heart rates are 45,  75 and 158 bpm   There were no pauses of 3 seconds or greater or episodes of sinus node or high degree AV block.   Rare single PVCs were present   Rare supraventricular ectopy was present.  There are no episodes of atrial fibrillation or flutter.   There were 7 triggered events one with a single PVC and with a single APC.  The others were sinus rhythm     Conclusion unremarkable extended ZIO monitor  EKG:  EKG is ordered today.  The ekg ordered today demonstrates normal sinus rhythm with no acute ST/T wave changes.  Recent Labs: 12/11/2018: ALT 37; TSH 2.349 12/14/2018: BUN 12; Creatinine, Ser 1.49; Hemoglobin 15.7; Platelets 216; Potassium 3.9; Sodium 136  Recent Lipid Panel No results found for: CHOL, TRIG, HDL, CHOLHDL, VLDL, LDLCALC, LDLDIRECT  Home Medications   Current Meds  Medication Sig  . valACYclovir (VALTREX) 500 MG  tablet Take 500 mg by mouth daily.      Review of Systems       Review of Systems  Constitution: Negative for chills, fever and malaise/fatigue.  Cardiovascular: Positive for palpitations. Negative for chest pain, dyspnea on exertion, irregular heartbeat, leg swelling, near-syncope, orthopnea and paroxysmal nocturnal dyspnea.  Respiratory: Negative for cough, shortness of breath and wheezing.   Gastrointestinal: Negative for nausea and vomiting.  Neurological: Negative for dizziness, light-headedness and weakness.   All other systems reviewed and are otherwise  negative except as noted above.  Physical Exam    VS:  BP 118/70 (BP Location: Left Arm, Patient Position: Sitting, Cuff Size: Normal)   Pulse 70   Resp 18   Ht 6\' 1"  (1.854 m)   Wt 179 lb 4 oz (81.3 kg)   SpO2 97%   BMI 23.65 kg/m  , BMI Body mass index is 23.65 kg/m. GEN: Well nourished, well developed, in no acute distress. HEENT: normal. Neck: Supple, no JVD, carotid bruits, or masses. Cardiac: RRR, no murmurs, rubs, or gallops. No clubbing, cyanosis, edema.  Radials/DP/PT 2+ and equal bilaterally.  Respiratory:  Respirations regular and unlabored, clear to auscultation bilaterally. GI: Soft, nontender, nondistended, BS + x 4. MS: No deformity or atrophy. Skin: Warm and dry, no rash. Neuro:  Strength and sensation are intact. Psych: Normal affect. Anxious.  Accessory Clinical Findings    ECG personally reviewed by me today - SR with no acute ST/T wave changes - no acute changes.  Assessment & Plan    1. Palpitations - Onset 4 days ago. No pro-arrthymic medications, no smoking. Does drink socially on the weekends. Endorses increased stress recently. Feels like his heart is "beating forcefully". Likely PVC/PAC. BMET, magnesium, TSH today. He will begin PRN Metoprolol as previously prescribed. Reassurance provided that these early beats are not dangerous. Do not believe we need to repeat a ZIO monitor today.  2. PVC - Known hx on monitor 11/2018. Plan as above. 3. PAC - Known hx on monitor 11/2018.   Disposition: Follow up as needed with Dr. Earney Navy, NP 11/14/2019, 2:41 PM

## 2019-11-14 NOTE — Patient Instructions (Signed)
Medication Instructions:  No medication changes today.  *If you need a refill on your cardiac medications before your next appointment, please call your pharmacy*  Lab Work: Your physician recommends that you return for lab work today: BMET, magnesium, TSH  If you have labs (blood work) drawn today and your tests are completely normal, you will receive your results only by: Marland Kitchen MyChart Message (if you have MyChart) OR . A paper copy in the mail If you have any lab test that is abnormal or we need to change your treatment, we will call you to review the results.  Testing/Procedures: You had an EKG today. It showed normal sinus rhythm.   Follow-Up: At [facility], you and your health needs are our priority.  As part of our continuing mission to provide you with exceptional heart care, we have created designated Provider Care Teams.  These Care Teams include your primary Cardiologist (physician) and Advanced Practice Providers (APPs -  Physician Assistants and Nurse Practitioners) who all work together to provide you with the care you need, when you need it.  Your next appointment:   As needed   The format for your next appointment:   Either In Person or Virtual  Provider:   You may see Shirlee More, MD or the following Advanced Practice Provider on your designated Care Team:    Laurann Montana, FNP  Other Instructions  Medication and Palpitations 1. Avoid all over-the-counter antihistamines except Claritin/Loratadine and Zyrtec/Cetrizine. 2. Avoid all combination including cold sinus allergies flu decongestant and sleep medications 3. You can use Robitussin DM Mucinex and Mucinex DM for cough. 4. can use Tylenol aspirin ibuprofen and naproxen but no combinations such as sleep or sinus.  Palpitations Palpitations are feelings that your heartbeat is not normal. Your heartbeat may feel like it is:  Uneven.  Faster than normal.  Fluttering.  Skipping a beat. This is usually  not a serious problem. In some cases, you may need tests to rule out any serious problems. Follow these instructions at home: Pay attention to any changes in your condition. Take these actions to help manage your symptoms: Eating and drinking  Avoid: ? Coffee, tea, soft drinks, and energy drinks. ? Chocolate. ? Alcohol. ? Diet pills. Lifestyle   Try to lower your stress. These things can help you relax: ? Yoga. ? Deep breathing and meditation. ? Exercise. ? Using words and images to create positive thoughts (guided imagery). ? Using your mind to control things in your body (biofeedback).  Do not use drugs.  Get plenty of rest and sleep. Keep a regular bed time. General instructions   Take over-the-counter and prescription medicines only as told by your doctor.  Do not use any products that contain nicotine or tobacco, such as cigarettes and e-cigarettes. If you need help quitting, ask your doctor.  Keep all follow-up visits as told by your doctor. This is important. You may need more tests if palpitations do not go away or get worse. Contact a doctor if:  Your symptoms last more than 24 hours.  Your symptoms occur more often. Get help right away if you:  Have chest pain.  Feel short of breath.  Have a very bad headache.  Feel dizzy.  Pass out (faint). Summary  Palpitations are feelings that your heartbeat is uneven or faster than normal. It may feel like your heart is fluttering or skipping a beat.  Avoid food and drinks that may cause palpitations. These include caffeine, chocolate, and alcohol.  Try to lower your stress. Do not smoke or use drugs.  Get help right away if you faint or have chest pain, shortness of breath, a severe headache, or dizziness. This information is not intended to replace advice given to you by your health care provider. Make sure you discuss any questions you have with your health care provider. Document Released: 09/16/2008  Document Revised: 01/20/2018 Document Reviewed: 01/20/2018 Elsevier Patient Education  2020 [employer].

## 2019-11-14 NOTE — Telephone Encounter (Signed)
New message  Patient c/o Palpitations:  High priority if patient c/o lightheadedness, shortness of breath, or chest pain  1) How long have you had palpitations/irregular HR/ Afib? Are you having the symptoms now? Yes  2) Are you currently experiencing lightheadedness, SOB or CP? No  3) Do you have a history of afib (atrial fibrillation) or irregular heart rhythm? Yes  4) Have you checked your BP or HR? (document readings if available): Per patient in normal range  5) Are you experiencing any other symptoms? Just Palpatations   Scheduled appointment with Laurann Montana PA on 11/14/19 at 2:15pm

## 2019-11-15 LAB — BASIC METABOLIC PANEL
BUN/Creatinine Ratio: 10 (ref 9–20)
BUN: 10 mg/dL (ref 6–24)
CO2: 26 mmol/L (ref 20–29)
Calcium: 10.1 mg/dL (ref 8.7–10.2)
Chloride: 100 mmol/L (ref 96–106)
Creatinine, Ser: 1.05 mg/dL (ref 0.76–1.27)
GFR calc Af Amer: 102 mL/min/{1.73_m2} (ref >59)
GFR calc non Af Amer: 88 mL/min/{1.73_m2} (ref >59)
Glucose: 82 mg/dL (ref 65–99)
Potassium: 4.5 mmol/L (ref 3.5–5.2)
Sodium: 141 mmol/L (ref 134–144)

## 2019-11-15 LAB — TSH: TSH: 2.62 u[IU]/mL (ref 0.450–4.500)

## 2019-11-15 LAB — MAGNESIUM: Magnesium: 2.3 mg/dL (ref 1.6–2.3)

## 2019-12-21 ENCOUNTER — Other Ambulatory Visit: Payer: Self-pay

## 2019-12-21 ENCOUNTER — Encounter (HOSPITAL_BASED_OUTPATIENT_CLINIC_OR_DEPARTMENT_OTHER): Payer: Self-pay | Admitting: *Deleted

## 2019-12-21 DIAGNOSIS — Z79899 Other long term (current) drug therapy: Secondary | ICD-10-CM | POA: Diagnosis not present

## 2019-12-21 DIAGNOSIS — R21 Rash and other nonspecific skin eruption: Secondary | ICD-10-CM | POA: Diagnosis present

## 2019-12-21 DIAGNOSIS — R0981 Nasal congestion: Secondary | ICD-10-CM | POA: Insufficient documentation

## 2019-12-21 DIAGNOSIS — R05 Cough: Secondary | ICD-10-CM | POA: Diagnosis not present

## 2019-12-21 DIAGNOSIS — Z20828 Contact with and (suspected) exposure to other viral communicable diseases: Secondary | ICD-10-CM | POA: Insufficient documentation

## 2019-12-21 DIAGNOSIS — L509 Urticaria, unspecified: Secondary | ICD-10-CM | POA: Diagnosis not present

## 2019-12-21 NOTE — ED Triage Notes (Signed)
Pt c/o rash to bil axillary x 1 day

## 2019-12-22 ENCOUNTER — Emergency Department (HOSPITAL_BASED_OUTPATIENT_CLINIC_OR_DEPARTMENT_OTHER)
Admission: EM | Admit: 2019-12-22 | Discharge: 2019-12-22 | Disposition: A | Discharge status: Home / Self Care | Payer: 59 | Attending: Emergency Medicine | Admitting: Emergency Medicine

## 2019-12-22 DIAGNOSIS — L509 Urticaria, unspecified: Secondary | ICD-10-CM

## 2019-12-22 LAB — SARS CORONAVIRUS 2 (TAT 6-24 HRS): SARS Coronavirus 2: NEGATIVE

## 2019-12-22 MED ORDER — CETIRIZINE HCL 5 MG/5ML PO SOLN
10.0000 mg | Freq: Once | ORAL | Status: AC
  Administered 2019-12-22: 10 mg via ORAL
  Filled 2019-12-22: qty 10

## 2019-12-22 MED ORDER — CETIRIZINE HCL 10 MG PO TABS: ORAL_TABLET | ORAL | 0 refills | Status: AC

## 2019-12-22 NOTE — ED Provider Notes (Signed)
[facility] Provider Note: Shaun Dell, MD, FACEP  CSN: [id number] MRN: [id number] ARRIVAL: 12/21/19 at 2348 ROOM: [facility]03/[facility]03   CHIEF COMPLAINT  Rash   HISTORY OF PRESENT ILLNESS  12/22/19 2:54 AM Shaun Stephens is a 38 y.o. male who recently returned from [place].  Yesterday he noticed an urticarial rash of his axillary regions.  Symptoms are mild and are resolving without treatment.  He has also had some cold symptoms (nasal congestion, cough) and is concerned about COVID-19.   Past Medical History:  Diagnosis Date  . Migraines    Dx 2005    Past Surgical History:  Procedure Laterality Date  . APPENDECTOMY      Family History  Problem Relation Age of Onset  . Breast cancer Mother   . Stroke Mother   . Heart failure Father   . Kidney disease Father     Social History   Tobacco Use  . Smoking status: Never Smoker  . Smokeless tobacco: Never Used  Substance Use Topics  . Alcohol use: Yes    Comment: 1 time per week  . Drug use: Never    Prior to Admission medications   Medication Sig Start Date End Date Taking? Authorizing Provider  cetirizine (ZYRTEC) 10 MG tablet Take 1 tablet daily as needed for rash or itching.  May take twice daily if needed. 12/22/19   Shaun Janosik, MD  metoprolol tartrate (LOPRESSOR) 25 MG tablet Take 1 tablet (25 mg total) by mouth every 6 (six) hours as needed (if heart rate exceeds 130 beats per minute). 02/03/19 05/04/19  Shaun Daub, MD  valACYclovir (VALTREX) 500 MG tablet Take 500 mg by mouth daily.    [provider]    Allergies Patient has no known allergies.   REVIEW OF SYSTEMS  Negative except as noted here or in the History of Present Illness.   PHYSICAL EXAMINATION  Initial Vital Signs Blood pressure (!) 141/81, pulse 88, temperature 98.9 F (37.2 C), temperature source Oral, resp. rate 18, height 6\' 1"  (1.854 m), weight 79.4 kg, SpO2 100 %.  Examination General: Well-developed,  well-nourished male in no acute distress; appearance consistent with age of record HENT: normocephalic; atraumatic Eyes: Normal appearance Neck: supple Heart: regular rate and rhythm Lungs: clear to auscultation bilaterally Abdomen: soft; nondistended; nontender; bowel sounds present Extremities: No deformity; full range of motion Neurologic: Awake, alert and oriented; motor function intact in all extremities and symmetric; no facial droop Skin: Warm and dry; resolving urticarial rash of skin just distal to axillae bilaterally Psychiatric: Normal mood and affect   RESULTS  Summary of this visit's results, reviewed and interpreted by myself:   EKG Interpretation  Date/Time:    Ventricular Rate:    PR Interval:    QRS Duration:   QT Interval:    QTC Calculation:   R Axis:     Text Interpretation:        Laboratory Studies: No results found for this or any previous visit (from the past 24 hour(s)). Imaging Studies: No results found.  ED COURSE and MDM  Nursing notes, initial and subsequent vitals signs, including pulse oximetry, reviewed and interpreted by myself.  Vitals:   12/21/19 2355 12/21/19 2356  BP:  (!) 141/81  Pulse:  88  Resp:  18  Temp:  98.9 F (37.2 C)  TempSrc:  Oral  SpO2:  100%  Weight: 79.4 kg   Height: 6\' 1"  (1.854 m)    Medications  cetirizine HCl (Zyrtec)  5 MG/5ML solution 10 mg (has no administration in time range)    We will prescribe cetirizine as needed for rash and we will test for COVID-19.  Shaun Stephens was evaluated in Emergency Department on 12/22/2019 for the symptoms described in the history of present illness. He was evaluated in the context of the global COVID-19 pandemic, which necessitated consideration that the patient might be at risk for infection with the SARS-CoV-2 virus that causes COVID-19. Institutional protocols and algorithms that pertain to the evaluation of patients at risk for COVID-19 are in a state of rapid  change based on information released by regulatory bodies including the CDC and federal and state organizations. These policies and algorithms were followed during the patient's care in the ED.   PROCEDURES  Procedures   ED DIAGNOSES     ICD-10-CM   1. Urticaria  L50.9        Shaun Kamp, MD 12/22/19 2706

## 2020-02-29 IMAGING — CR DG CHEST 2V
2 series · 2 of 2 positions shown · non-contrast
Comparison: None.

CLINICAL DATA: Palpitation

EXAM:
CHEST - 2 VIEW

[w chest pa]
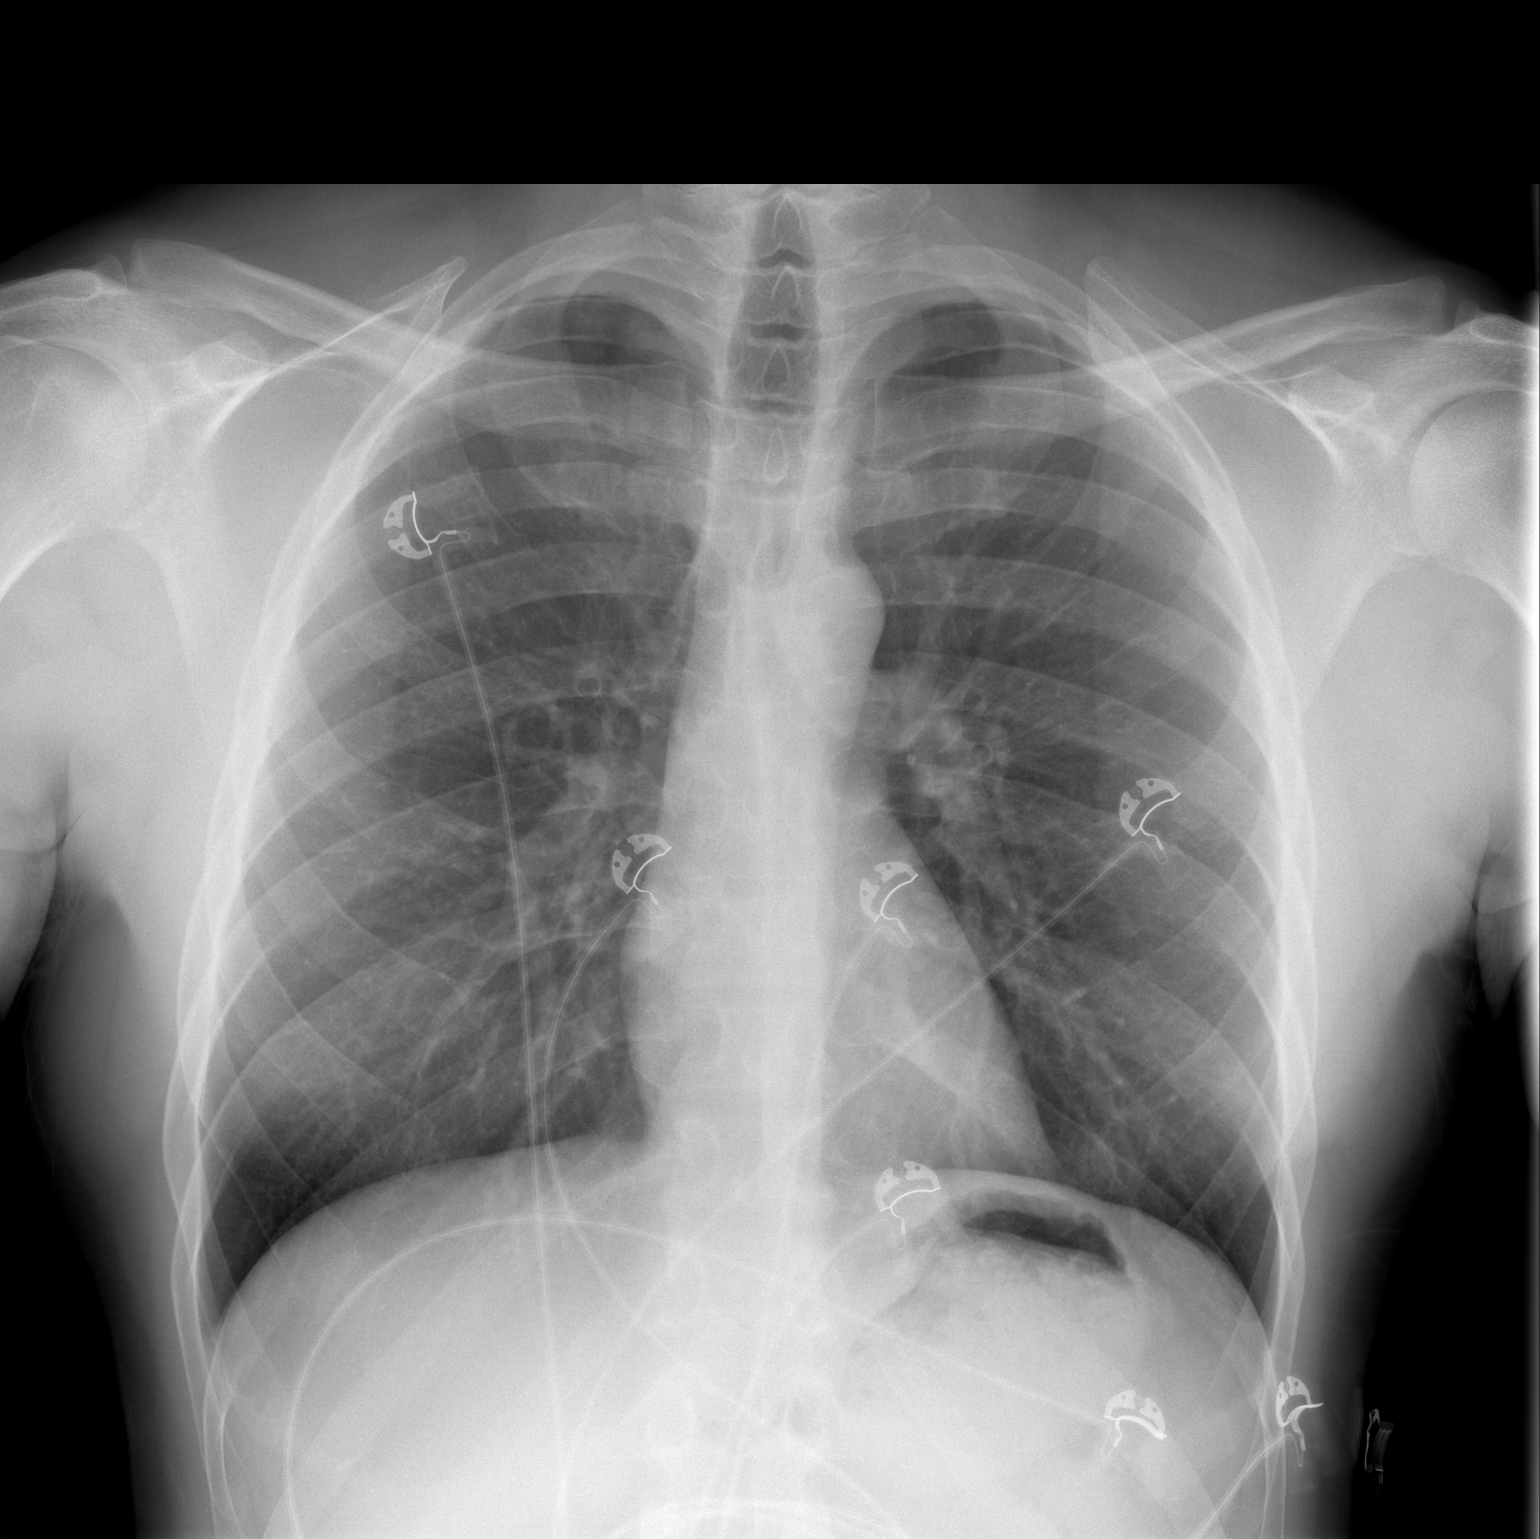

[w chest lat]
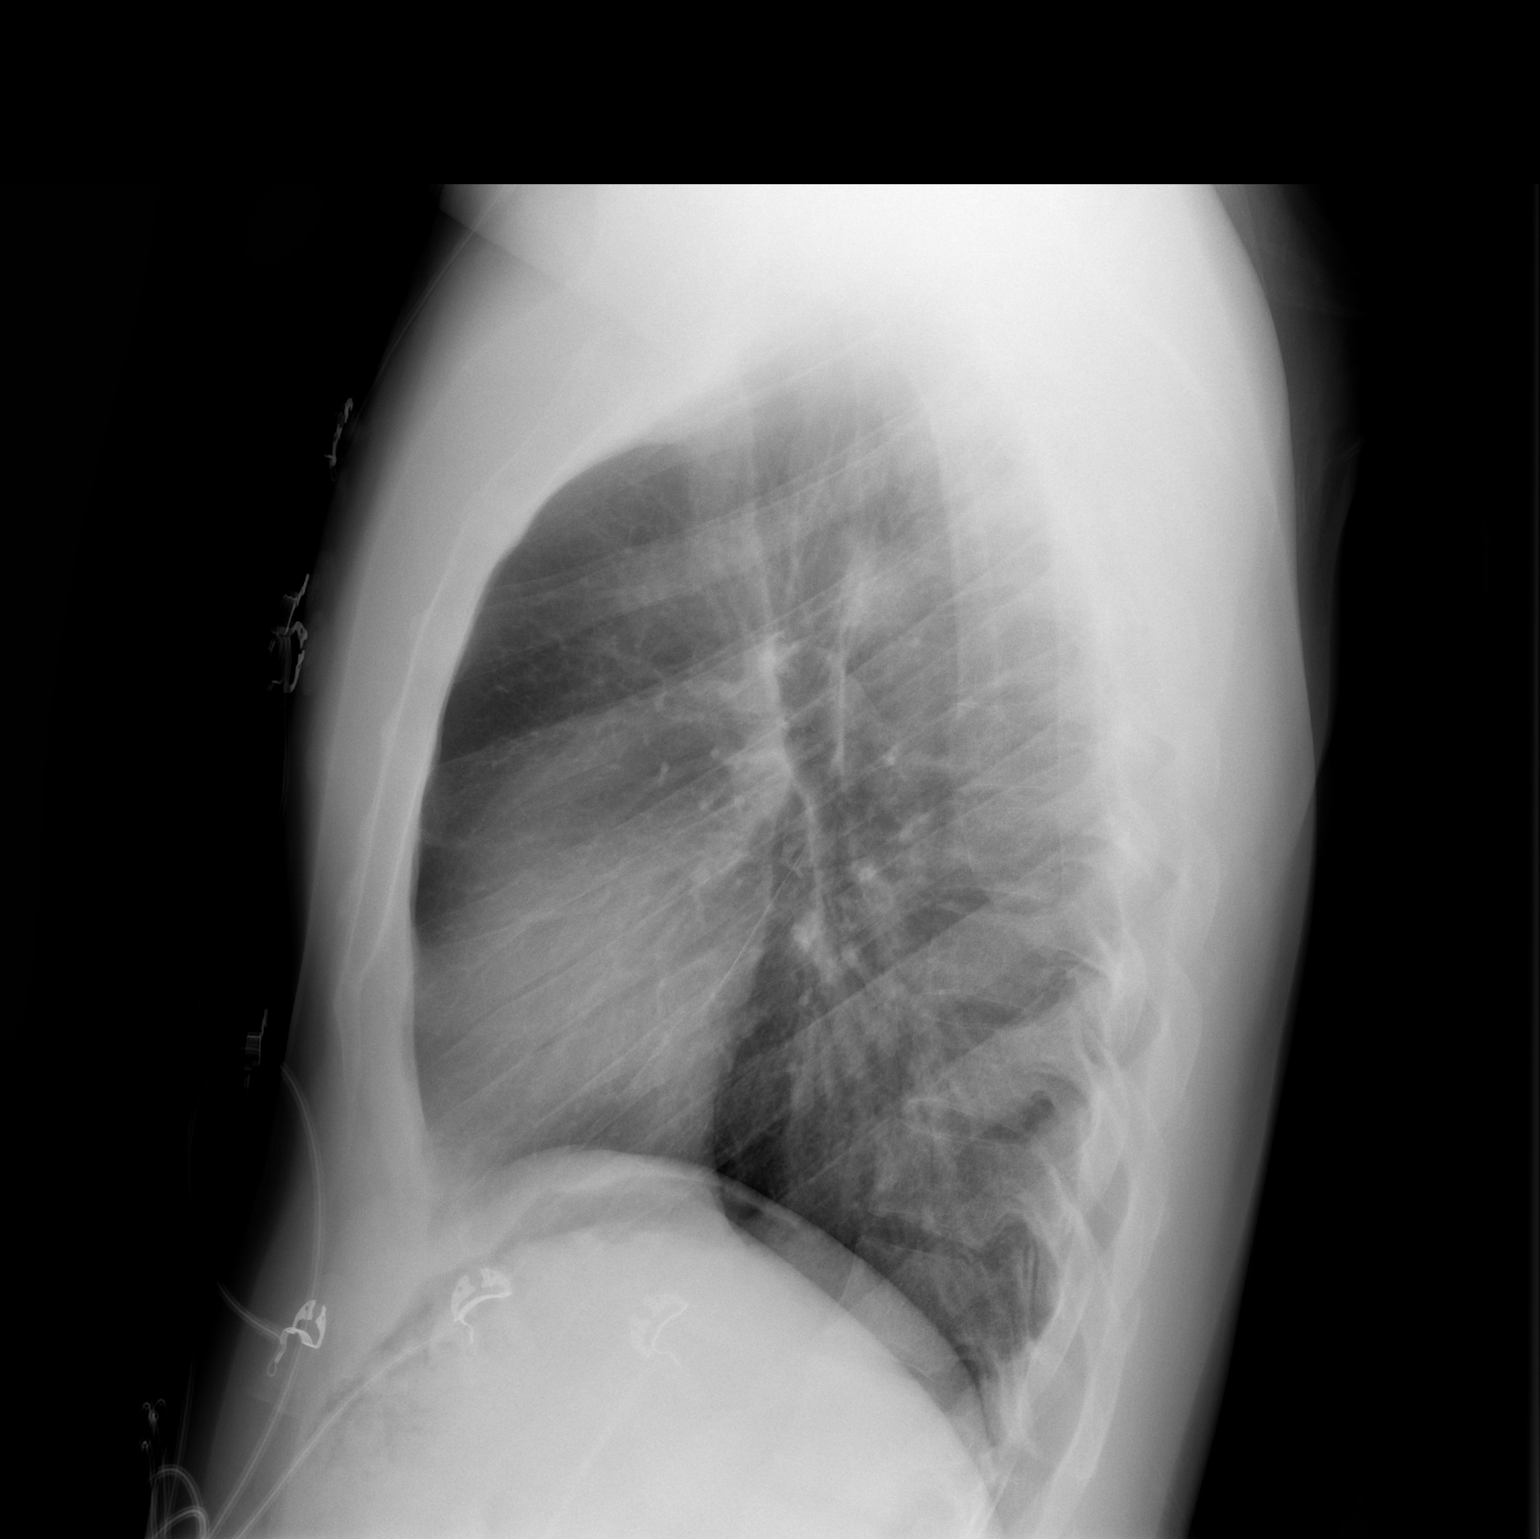

[2 of 2 positions shown; findings below may reference images not displayed]

FINDINGS: The heart size and mediastinal contours are within normal limits.
Both lungs are clear. The visualized skeletal structures are
unremarkable.
IMPRESSION: No active cardiopulmonary disease.

## 2021-01-09 ENCOUNTER — Emergency Department (HOSPITAL_BASED_OUTPATIENT_CLINIC_OR_DEPARTMENT_OTHER)
Admission: EM | Admit: 2021-01-09 | Discharge: 2021-01-10 | Disposition: A | Discharge status: Home / Self Care | Payer: 59 | Source: Home / Self Care | Attending: Emergency Medicine | Admitting: Emergency Medicine

## 2021-01-09 ENCOUNTER — Encounter (HOSPITAL_BASED_OUTPATIENT_CLINIC_OR_DEPARTMENT_OTHER): Payer: Self-pay | Admitting: *Deleted

## 2021-01-09 ENCOUNTER — Emergency Department (HOSPITAL_BASED_OUTPATIENT_CLINIC_OR_DEPARTMENT_OTHER)
Admission: EM | Admit: 2021-01-09 | Discharge: 2021-01-09 | Disposition: A | Discharge status: Home / Self Care | Payer: 59 | Attending: Emergency Medicine | Admitting: Emergency Medicine

## 2021-01-09 ENCOUNTER — Other Ambulatory Visit: Payer: Self-pay

## 2021-01-09 ENCOUNTER — Emergency Department (HOSPITAL_BASED_OUTPATIENT_CLINIC_OR_DEPARTMENT_OTHER): Payer: 59

## 2021-01-09 DIAGNOSIS — T7840XA Allergy, unspecified, initial encounter: Secondary | ICD-10-CM | POA: Insufficient documentation

## 2021-01-09 DIAGNOSIS — R059 Cough, unspecified: Secondary | ICD-10-CM | POA: Insufficient documentation

## 2021-01-09 DIAGNOSIS — Z20822 Contact with and (suspected) exposure to covid-19: Secondary | ICD-10-CM | POA: Insufficient documentation

## 2021-01-09 DIAGNOSIS — R5383 Other fatigue: Secondary | ICD-10-CM | POA: Insufficient documentation

## 2021-01-09 DIAGNOSIS — R072 Precordial pain: Secondary | ICD-10-CM | POA: Diagnosis not present

## 2021-01-09 DIAGNOSIS — R519 Headache, unspecified: Secondary | ICD-10-CM | POA: Insufficient documentation

## 2021-01-09 DIAGNOSIS — Z5321 Procedure and treatment not carried out due to patient leaving prior to being seen by health care provider: Secondary | ICD-10-CM | POA: Insufficient documentation

## 2021-01-09 DIAGNOSIS — R220 Localized swelling, mass and lump, head: Secondary | ICD-10-CM

## 2021-01-09 MED ORDER — FAMOTIDINE 20 MG PO TABS
40.0000 mg | ORAL_TABLET | Freq: Once | ORAL | Status: AC
  Administered 2021-01-10: 40 mg via ORAL
  Filled 2021-01-09: qty 2

## 2021-01-09 MED ORDER — PREDNISONE 50 MG PO TABS
60.0000 mg | ORAL_TABLET | Freq: Once | ORAL | Status: AC
  Administered 2021-01-10: 60 mg via ORAL
  Filled 2021-01-09: qty 1

## 2021-01-09 MED ORDER — DIPHENHYDRAMINE HCL 25 MG PO CAPS
50.0000 mg | ORAL_CAPSULE | Freq: Once | ORAL | Status: AC
  Administered 2021-01-10: 50 mg via ORAL
  Filled 2021-01-09: qty 2

## 2021-01-09 NOTE — ED Triage Notes (Signed)
Pt reports covid sx , with covid exposure , motrin 2 tabs x 4 hrs ago , lower lip swelling and rash

## 2021-01-09 NOTE — ED Triage Notes (Signed)
Ovid exposure with sx x 2 days

## 2021-01-10 LAB — COMPREHENSIVE METABOLIC PANEL
ALT: 36 U/L (ref 0–44)
AST: 21 U/L (ref 15–41)
Albumin: 4.1 g/dL (ref 3.5–5.0)
Alkaline Phosphatase: 80 U/L (ref 38–126)
Anion gap: 10 (ref 5–15)
BUN: 15 mg/dL (ref 6–20)
CO2: 25 mmol/L (ref 22–32)
Calcium: 9 mg/dL (ref 8.9–10.3)
Chloride: 100 mmol/L (ref 98–111)
Creatinine, Ser: 1.19 mg/dL (ref 0.61–1.24)
GFR, Estimated: >60 mL/min (ref >60)
Glucose, Bld: 93 mg/dL (ref 70–99)
Potassium: 3.6 mmol/L (ref 3.5–5.1)
Sodium: 135 mmol/L (ref 135–145)
Total Bilirubin: 0.6 mg/dL (ref 0.3–1.2)
Total Protein: 7.5 g/dL (ref 6.5–8.1)

## 2021-01-10 LAB — CBC WITH DIFFERENTIAL/PLATELET
Abs Immature Granulocytes: 0.01 10*3/uL (ref 0.00–0.07)
Basophils Absolute: 0 10*3/uL (ref 0.0–0.1)
Basophils Relative: 0 %
Eosinophils Absolute: 0 10*3/uL (ref 0.0–0.5)
Eosinophils Relative: 1 %
HCT: 46.4 % (ref 39.0–52.0)
Hemoglobin: 15.8 g/dL (ref 13.0–17.0)
Immature Granulocytes: 0 %
Lymphocytes Relative: 16 %
Lymphs Abs: 0.8 10*3/uL (ref 0.7–4.0)
MCH: 31.1 pg (ref 26.0–34.0)
MCHC: 34.1 g/dL (ref 30.0–36.0)
MCV: 91.3 fL (ref 80.0–100.0)
Monocytes Absolute: 0.7 10*3/uL (ref 0.1–1.0)
Monocytes Relative: 13 %
Neutro Abs: 3.5 10*3/uL (ref 1.7–7.7)
Neutrophils Relative %: 70 %
Platelets: 211 10*3/uL (ref 150–400)
RBC: 5.08 MIL/uL (ref 4.22–5.81)
RDW: 12.1 % (ref 11.5–15.5)
WBC: 5 10*3/uL (ref 4.0–10.5)
nRBC: 0 % (ref 0.0–0.2)

## 2021-01-10 LAB — SARS CORONAVIRUS 2 (TAT 6-24 HRS): SARS Coronavirus 2: NEGATIVE

## 2021-01-10 LAB — TROPONIN I (HIGH SENSITIVITY)
Troponin I (High Sensitivity): <2 ng/L (ref <18)
Troponin I (High Sensitivity): <2 ng/L (ref <18)

## 2021-01-10 MED ORDER — EPINEPHRINE 0.3 MG/0.3ML IJ SOAJ: 0.3000 mg | INTRAMUSCULAR | 0 refills | Status: AC | PRN

## 2021-01-10 MED ORDER — EPINEPHRINE 0.3 MG/0.3ML IJ SOAJ
0.3000 mg | Freq: Once | INTRAMUSCULAR | Status: AC
  Administered 2021-01-10: 0.3 mg via INTRAMUSCULAR
  Filled 2021-01-10: qty 0.3

## 2021-01-10 MED ORDER — PREDNISONE 10 MG PO TABS: 20.0000 mg | ORAL_TABLET | Freq: Every day | ORAL | 0 refills | Status: AC

## 2021-01-10 NOTE — Discharge Instructions (Signed)
You were seen in the emergency department today with chest discomfort and lip swelling along with a rash.  This seems most consistent with an allergic reaction and I have prescribed a course of steroid along with the EpiPen.  Please have these prescriptions filled and keep the EpiPen with you.  Use it if you develop worsening swelling in the lip or if you develop tongue swelling, throat tightness, shortness of breath symptoms.  You will take steroid for the next several days.  Please follow closely with your primary care doctor.  In terms of your chest discomfort your labs and EKG today were all reassuring and showed no evidence of injury to the heart.  If your chest pain suddenly worsens or develops shortness of breath you should call 911 and/or return to the emergency department.  If you have to use your EpiPen you should also return to the emergency department.  Given your close contact with COVID-19 positive individuals we are testing you for COVID.  We will have you remain in quarantine for the next 5 days.  You can follow the test results in the MyChart app which will come back in the next 6 to 24 hours.  There is information in his discharge paperwork about how to set that up on your phone if you do not already have it downloaded.

## 2021-01-10 NOTE — ED Provider Notes (Signed)
Emergency Department Provider Note   I have reviewed the triage vital signs and the nursing notes.   HISTORY  Chief Complaint Allergic Reaction   HPI Shaun Stephens is a 40 y.o. male with past medical history reviewed below presents to the emergency department with multiple complaints including recent COVID exposure, lip swelling with a rash, chest discomfort.  Patient states he was here with 2 family members earlier in the emergency department have tested positive for COVID.  He developed some associated cough with headache but denies fevers.  He is requesting testing for COVID.  He returned, however, due to acute onset rash and lip swelling which began shortly after taking ibuprofen for headache.  He states that he believes this may have been the first time he took ibuprofen.  He notes that the rash has mostly resolved but continues to have swelling in the lower lip.  Denies any tongue swelling or tightness in the back of the throat.  No shortness of breath.  He did have some central chest tightness with no radiation of symptoms or other modifying factors.    Past Medical History:  Diagnosis Date  . Migraines    Dx 2005    Patient Active Problem List   Diagnosis Date Noted  . Palpitation 12/15/2018  . Chest pain in adult 12/15/2018    Past Surgical History:  Procedure Laterality Date  . APPENDECTOMY      Allergies Patient has no known allergies.  Family History  Problem Relation Age of Onset  . Breast cancer Mother   . Stroke Mother   . Heart failure Father   . Kidney disease Father     Social History Social History   Tobacco Use  . Smoking status: Never Smoker  . Smokeless tobacco: Never Used  Vaping Use  . Vaping Use: Never used  Substance Use Topics  . Alcohol use: Yes    Comment: 1 time per week  . Drug use: Never    Review of Systems  Constitutional: No fever/chills Eyes: No visual changes. ENT: No sore throat. Lower lip swelling.   Cardiovascular: Denies chest pain. Respiratory: Denies shortness of breath. Gastrointestinal: No abdominal pain.  No nausea, no vomiting.  No diarrhea.  No constipation. Genitourinary: Negative for dysuria. Musculoskeletal: Negative for back pain. Positive body aches.  Skin: Positive rash.  Neurological: Negative for focal weakness or numbness. Positive HA.   10-point ROS otherwise negative.  ____________________________________________   PHYSICAL EXAM:  VITAL SIGNS: ED Triage Vitals  Enc Vitals Group     BP 01/09/21 2318 125/68     Pulse Rate 01/09/21 2318 84     Resp 01/09/21 2318 18     Temp 01/09/21 2318 98.2 F (36.8 C)     Temp src --      SpO2 01/09/21 2318 100 %     Weight 01/09/21 2317 185 lb (83.9 kg)     Height 01/09/21 2317 6\' 1"  (1.854 m)   Constitutional: Alert and oriented. Well appearing and in no acute distress. Eyes: Conjunctivae are normal.  Head: Atraumatic. Nose: No congestion/rhinnorhea. Mouth/Throat: Mucous membranes are moist. Posterior pharynx is normal. Swelling to the upper and lower lip but worse lower. Managing oral secretions and speaking in a clear voice.  Cardiovascular: Normal rate, regular rhythm. Good peripheral circulation. Grossly normal heart sounds.   Respiratory: Normal respiratory effort.  No retractions. Lungs CTAB. Gastrointestinal: Soft and nontender. No distention.  Musculoskeletal: No lower extremity tenderness nor edema. No gross deformities  of extremities. Neurologic:  Normal speech and language. Skin:  Skin is warm, dry and intact. No rash noted. No urticaria.   ____________________________________________   LABS (all labs ordered are listed, but only abnormal results are displayed)  Labs Reviewed  SARS CORONAVIRUS 2 (TAT 6-24 HRS)  COMPREHENSIVE METABOLIC PANEL  CBC WITH DIFFERENTIAL/PLATELET  TROPONIN I (HIGH SENSITIVITY)  TROPONIN I (HIGH SENSITIVITY)   ____________________________________________  EKG   EKG  Interpretation  Date/Time:  Thursday January 10 2021 00:14:04 EST Ventricular Rate:  75 PR Interval:    QRS Duration: 82 QT Interval:  359 QTC Calculation: 401 R Axis:   31 Text Interpretation: Sinus rhythm Abnormal R-wave progression, early transition Nonspecific ST changes No STEMI Confirmed by Alona Bene [phone]) on 01/10/2021 12:54:28 AM       ____________________________________________  RADIOLOGY  DG Chest Portable 1 View  Result Date: 01/10/2021 CLINICAL DATA:  COVID like symptoms with exposure, lip swelling and rash EXAM: PORTABLE CHEST 1 VIEW COMPARISON:  12/11/2018 FINDINGS: No consolidation, features of edema, pneumothorax, or effusion. Pulmonary vascularity is normally distributed. The cardiomediastinal contours are unremarkable. No acute osseous or soft tissue abnormality. IMPRESSION: No acute cardiopulmonary abnormality. Electronically Signed   By: Kreg Shropshire M.D.   On: 01/10/2021 00:12    ____________________________________________   PROCEDURES  Procedure(s) performed:   .Critical Care Performed by: Maia Plan, MD Authorized by: Maia Plan, MD   Critical care provider statement:    Critical care time (minutes):  35   Critical care time was exclusive of:  Separately billable procedures and treating other patients and teaching time   Critical care was necessary to treat or prevent imminent or life-threatening deterioration of the following conditions: anaphylaxis    Critical care was time spent personally by me on the following activities:  Discussions with consultants, evaluation of patient's response to treatment, examination of patient, ordering and performing treatments and interventions, ordering and review of laboratory studies, ordering and review of radiographic studies, pulse oximetry, re-evaluation of patient's condition, obtaining history from patient or surrogate, review of old charts, blood draw for specimens and development of treatment plan  with patient or surrogate   I assumed direction of critical care for this patient from another provider in my specialty: no       ____________________________________________   INITIAL IMPRESSION / ASSESSMENT AND PLAN / ED COURSE  Pertinent labs & imaging results that were available during my care of the patient were reviewed by me and considered in my medical decision making (see chart for details).   Patient presents the emergency department for evaluation of COVID exposure.  Plan for testing with patient to follow the test results in the MyChart app.  He does have multiple family members positive and is likely positive himself.  Advised remaining in quarantine per CDC guidelines.  Patient has secondary symptoms which are not sure if they are related.  He is having report of urticarial type rash earlier although this is resolved.  He does have some residual lip swelling.  Not consistent with angioedema.  Patient not on ACE or ARB medications.  Plan for monitoring here after Benadryl, Pepcid, steroid.  Does not require epinephrine at this time but will monitor closely.  Patient also describing some central chest tightness.  Plan for labs including chest x-ray and troponins. Doubt PE clinically although this was considered.   01:18 AM  Patient feels that his lip swelling is getting slightly worse.  He has no tongue or oropharyngeal  swelling.  No subjective throat tightness or shortness of breath.  Plan for epinephrine and reassess.   04:00 AM  Patient reevaluated.  He feels that his lip swelling is decreased.  He is not feeling tightness or swelling in his throat or tongue area.  He speaking with a clear voice and managing oral secretions.  No rash appreciable here.  Labs and chest x-ray reviewed and reassuring.  Troponins negative x2.  Considered hereditary angioedema as a possibility to explain lip swelling but in the setting of acute onset of symptoms of the rash feel that allergic reaction  is more likely.  Prescription given for steroid burst along with EpiPen.  Patient to follow-up with his PCP in the coming week.  He is also being tested for COVID-19 given his close contact with positive individuals.  Given his history, I have advised that he quarantine for 5 days with masking for 5 days after consistent with most recent CDC guidelines.  Discussed ED return precautions in detail.   Zyair Thielman was evaluated in Emergency Department on 01/10/2021 for the symptoms described in the history of present illness. He was evaluated in the context of the global COVID-19 pandemic, which necessitated consideration that the patient might be at risk for infection with the SARS-CoV-2 virus that causes COVID-19. Institutional protocols and algorithms that pertain to the evaluation of patients at risk for COVID-19 are in a state of rapid change based on information released by regulatory bodies including the CDC and federal and state organizations. These policies and algorithms were followed during the patient's care in the ED.  ____________________________________________  FINAL CLINICAL IMPRESSION(S) / ED DIAGNOSES  Final diagnoses:  Precordial chest pain  Allergic reaction, initial encounter  Lip swelling  Close exposure to COVID-19 virus     MEDICATIONS GIVEN DURING THIS VISIT:  Medications  diphenhydrAMINE (BENADRYL) capsule 50 mg (50 mg Oral Given 01/10/21 0005)  famotidine (PEPCID) tablet 40 mg (40 mg Oral Given 01/10/21 0004)  predniSONE (DELTASONE) tablet 60 mg (60 mg Oral Given 01/10/21 0005)  EPINEPHrine (EPI-PEN) injection 0.3 mg (0.3 mg Intramuscular Given 01/10/21 0122)     NEW OUTPATIENT MEDICATIONS STARTED DURING THIS VISIT:  New Prescriptions   EPINEPHRINE 0.3 MG/0.3 ML IJ SOAJ INJECTION    Inject 0.3 mg into the muscle as needed for anaphylaxis.   PREDNISONE (DELTASONE) 10 MG TABLET    Take 2 tablets (20 mg total) by mouth daily for 4 days.    Note:  This document was  prepared using Dragon voice recognition software and may include unintentional dictation errors.  Alona Bene, MD, [facility]    Birdie Fetty, Arlyss Repress, MD 01/10/21 [phone]

## 2022-03-30 IMAGING — DX DG CHEST 1V PORT
1 series · 1 of 1 positions shown · non-contrast
Comparison: 12/11/2018

CLINICAL DATA: COVID like symptoms with exposure, lip swelling and
rash

EXAM:
PORTABLE CHEST 1 VIEW

[chest ap]
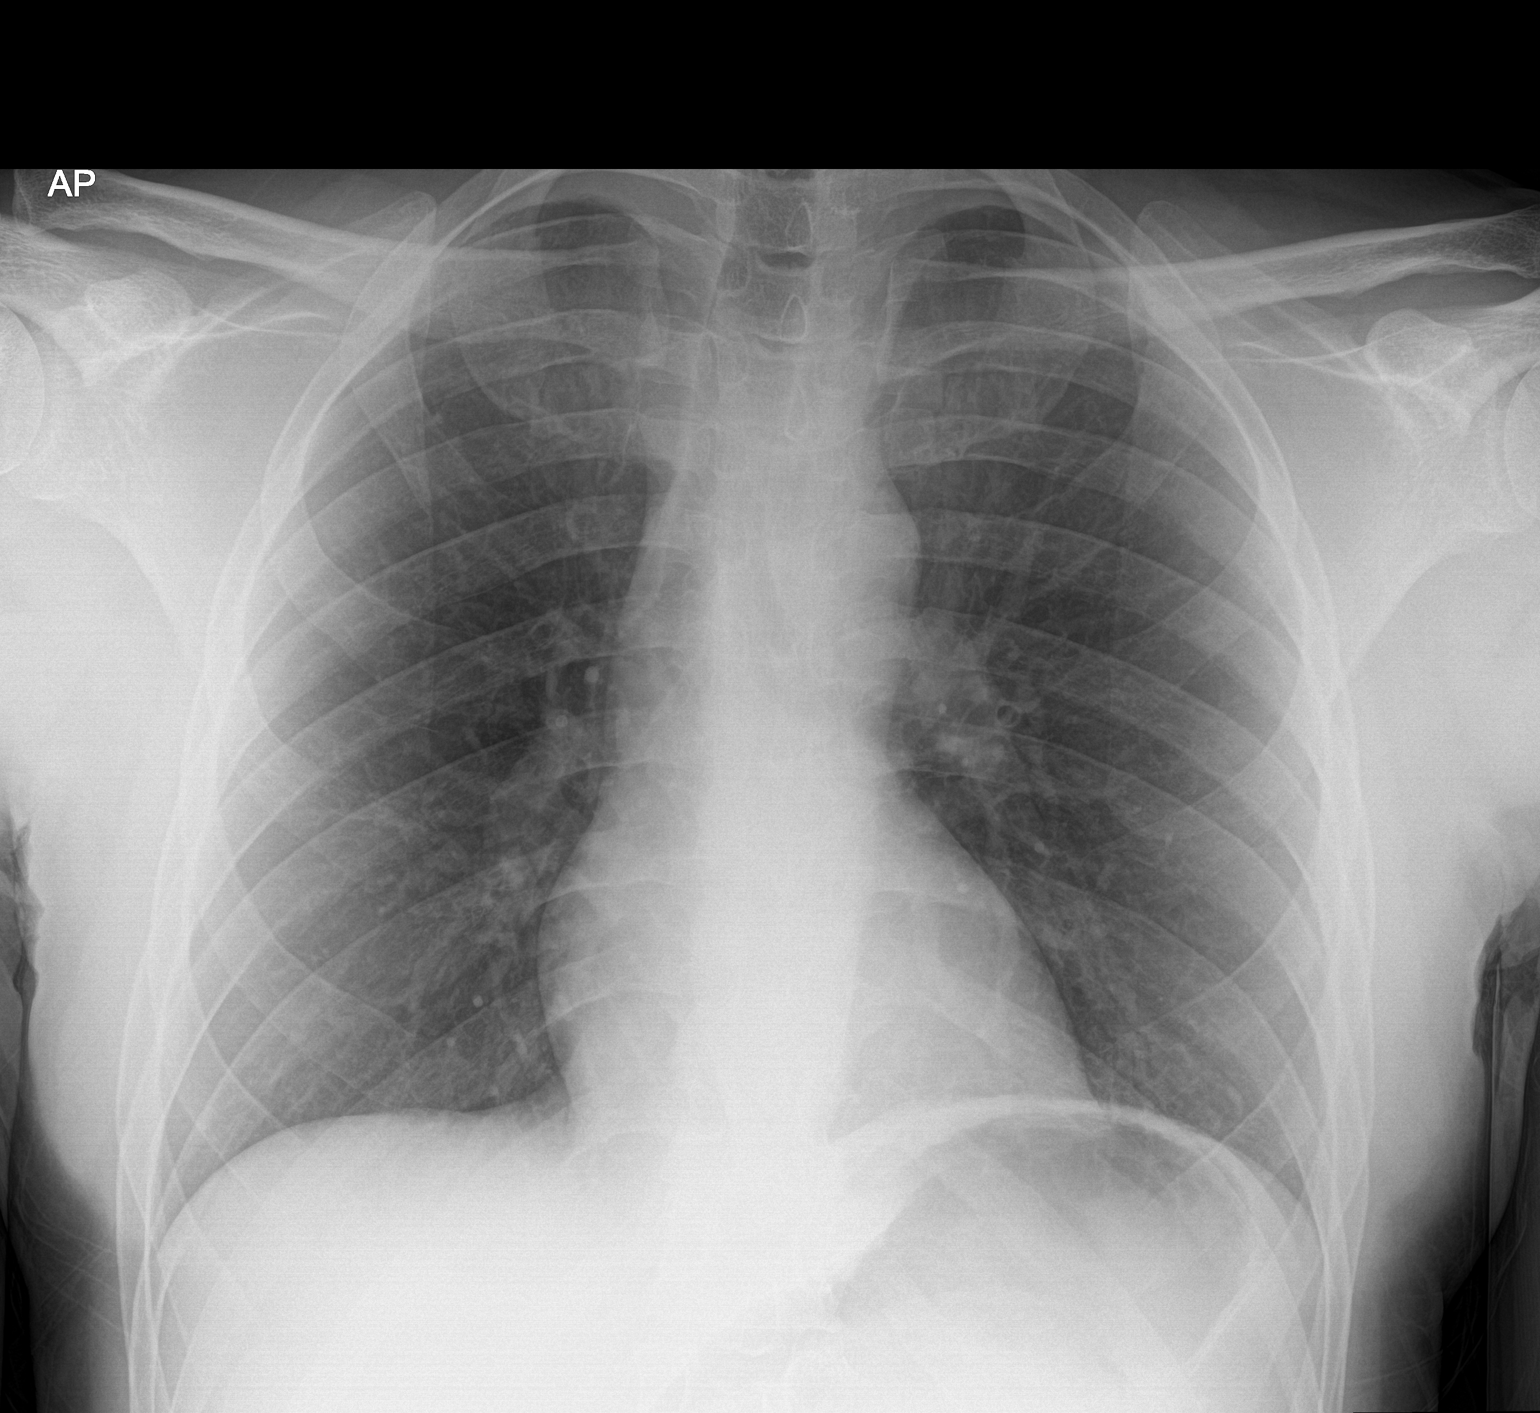

[1 of 1 positions shown; findings below may reference images not displayed]

FINDINGS: No consolidation, features of edema, pneumothorax, or effusion.
Pulmonary vascularity is normally distributed. The cardiomediastinal
contours are unremarkable. No acute osseous or soft tissue
abnormality.
IMPRESSION: No acute cardiopulmonary abnormality.

## 2022-10-16 ENCOUNTER — Encounter (HOSPITAL_BASED_OUTPATIENT_CLINIC_OR_DEPARTMENT_OTHER): Payer: Self-pay

## 2022-10-16 ENCOUNTER — Emergency Department (HOSPITAL_BASED_OUTPATIENT_CLINIC_OR_DEPARTMENT_OTHER)
Admission: EM | Admit: 2022-10-16 | Discharge: 2022-10-17 | Disposition: A | Discharge status: Home / Self Care | Payer: 59 | Attending: Emergency Medicine | Admitting: Emergency Medicine

## 2022-10-16 ENCOUNTER — Other Ambulatory Visit: Payer: Self-pay

## 2022-10-16 DIAGNOSIS — R42 Dizziness and giddiness: Secondary | ICD-10-CM

## 2022-10-16 DIAGNOSIS — E860 Dehydration: Secondary | ICD-10-CM

## 2022-10-16 LAB — BASIC METABOLIC PANEL
Anion gap: 8 (ref 5–15)
BUN: 16 mg/dL (ref 6–20)
CO2: 26 mmol/L (ref 22–32)
Calcium: 9.9 mg/dL (ref 8.9–10.3)
Chloride: 102 mmol/L (ref 98–111)
Creatinine, Ser: 1.19 mg/dL (ref 0.61–1.24)
GFR, Estimated: >60 mL/min (ref >60)
Glucose, Bld: 133 mg/dL — ABNORMAL HIGH (ref 70–99)
Potassium: 3.8 mmol/L (ref 3.5–5.1)
Sodium: 136 mmol/L (ref 135–145)

## 2022-10-16 LAB — URINALYSIS, ROUTINE W REFLEX MICROSCOPIC
Bilirubin Urine: NEGATIVE
Glucose, UA: NEGATIVE mg/dL
Hgb urine dipstick: NEGATIVE
Ketones, ur: NEGATIVE mg/dL
Leukocytes,Ua: NEGATIVE
Nitrite: NEGATIVE
Protein, ur: NEGATIVE mg/dL
Specific Gravity, Urine: <=1.005 (ref 1.005–1.030)
pH: 5.5 (ref 5.0–8.0)

## 2022-10-16 LAB — CBC
HCT: 49.3 % (ref 39.0–52.0)
Hemoglobin: 16.5 g/dL (ref 13.0–17.0)
MCH: 30.2 pg (ref 26.0–34.0)
MCHC: 33.5 g/dL (ref 30.0–36.0)
MCV: 90.3 fL (ref 80.0–100.0)
Platelets: 242 10*3/uL (ref 150–400)
RBC: 5.46 MIL/uL (ref 4.22–5.81)
RDW: 12.5 % (ref 11.5–15.5)
WBC: 6 10*3/uL (ref 4.0–10.5)
nRBC: 0 % (ref 0.0–0.2)

## 2022-10-16 LAB — CBG MONITORING, ED: Glucose-Capillary: 137 mg/dL — ABNORMAL HIGH (ref 70–99)

## 2022-10-16 MED ORDER — SODIUM CHLORIDE 0.9 % IV BOLUS
1000.0000 mL | Freq: Once | INTRAVENOUS | Status: AC
  Administered 2022-10-16: 1000 mL via INTRAVENOUS

## 2022-10-16 NOTE — ED Provider Notes (Signed)
[facility] EMERGENCY DEPARTMENT Provider Note   CSN: [id number] Arrival date & time: 10/16/22  1800     History  Chief Complaint  Patient presents with   Dizziness    Shaun Stephens is a 41 y.o. male.  Patient presents to the hospital complaining of a near syncopal episode.  Patient states that on Saturday he was at home cutting celebration.  He endorses drinking more heavily than usual over Thursday and Friday not eating very much.  He states that Saturday morning he was at [employer] with a friend when he started feeling like he was going to blackout.  His friend went to get him a drink and someone in the line gave him some candy.  He endorses feeling better after eating candy and eating some food during the day.  He states he has felt normal for the past 3 to 4 days.  He states that today he had breakfast.  He endorses drinking 1 bottle of water after breakfast.  He states that this afternoon after work chemo to start the shower and when he sat down and stood up he felt like he was lightheaded again.  He came to the emergency department for evaluation.  He denies chest pain, shortness of breath, nausea, vomiting, abdominal pain, dysuria, headache.  Endorses feeling lightheaded.  Denies dizziness.  Past medical history significant for migraines and palpitations  HPI     Home Medications Prior to Admission medications   Medication Sig Start Date End Date Taking? Authorizing Provider  cetirizine (ZYRTEC) 10 MG tablet Take 1 tablet daily as needed for rash or itching.  May take twice daily if needed. 12/22/19   Molpus, John, MD  EPINEPHrine 0.3 mg/0.3 mL IJ SOAJ injection Inject 0.3 mg into the muscle as needed for anaphylaxis. 01/10/21   Long, Arlyss Repress, MD  metoprolol tartrate (LOPRESSOR) 25 MG tablet Take 1 tablet (25 mg total) by mouth every 6 (six) hours as needed (if heart rate exceeds 130 beats per minute). 02/03/19 05/04/19  Baldo Daub, MD  valACYclovir (VALTREX) 500 MG  tablet Take 500 mg by mouth daily.    [provider]      Allergies    Patient has no known allergies.    Review of Systems   Review of Systems  Respiratory:  Negative for shortness of breath.   Cardiovascular:  Negative for chest pain and palpitations.  Gastrointestinal:  Negative for nausea and vomiting.  Neurological:  Positive for light-headedness. Negative for headaches.    Physical Exam Updated Vital Signs BP (!) 141/116   Pulse 71   Temp 98.5 F (36.9 C) (Oral)   Resp 19   Ht 6\' 1"  (1.854 m)   Wt 83.9 kg   SpO2 100%   BMI 24.41 kg/m  Physical Exam Vitals and nursing note reviewed.  Constitutional:      General: He is not in acute distress.    Appearance: He is well-developed.  HENT:     Head: Normocephalic and atraumatic.     Mouth/Throat:     Mouth: Mucous membranes are moist.  Eyes:     Extraocular Movements: Extraocular movements intact.     Conjunctiva/sclera: Conjunctivae normal.     Pupils: Pupils are equal, round, and reactive to light.  Cardiovascular:     Rate and Rhythm: Normal rate and regular rhythm.     Heart sounds: No murmur heard. Pulmonary:     Effort: Pulmonary effort is normal. No respiratory distress.  Breath sounds: Normal breath sounds.  Abdominal:     Palpations: Abdomen is soft.     Tenderness: There is no abdominal tenderness.  Musculoskeletal:        General: No swelling.     Cervical back: Neck supple.  Skin:    General: Skin is warm and dry.     Capillary Refill: Capillary refill takes less than 2 seconds.  Neurological:     General: No focal deficit present.     Mental Status: He is alert.     Sensory: No sensory deficit.     Motor: No weakness.     Coordination: Coordination normal.     Gait: Gait normal.     Comments: Cranial nerves II through VII, XI, XII intact  Psychiatric:        Mood and Affect: Mood normal.     ED Results / Procedures / Treatments   Labs (all labs ordered are listed, but only  abnormal results are displayed) Labs Reviewed  BASIC METABOLIC PANEL - Abnormal; Notable for the following components:      Result Value   Glucose, Bld 133 (*)    All other components within normal limits  CBG MONITORING, ED - Abnormal; Notable for the following components:   Glucose-Capillary 137 (*)    All other components within normal limits  CBC  URINALYSIS, ROUTINE W REFLEX MICROSCOPIC    EKG EKG Interpretation  Date/Time:  Thursday October 16 2022 18:37:46 EDT Ventricular Rate:  91 PR Interval:  159 QRS Duration: 82 QT Interval:  332 QTC Calculation: 409 R Axis:   29 Text Interpretation: Sinus rhythm Abnormal R-wave progression, early transition when comapred to prior, similar appearance. No STEMI Confirmed by Antony Blackbird [phone]) on 10/16/2022 6:41:40 PM  Radiology No results found.  Procedures Procedures    Medications Ordered in ED Medications  sodium chloride 0.9 % bolus 1,000 mL (0 mLs Intravenous Stopped 10/16/22 2320)    ED Course/ Medical Decision Making/ A&P                           Medical Decision Making Amount and/or Complexity of Data Reviewed Labs: ordered.   This patient presents to the ED for concern of near syncopal episode, this involves an extensive number of treatment options, and is a complaint that carries with it a high risk of complications and morbidity.  The differential diagnosis includes dysrhythmia, dehydration, vasovagal syncope, CVA, and others   Co morbidities that complicate the patient evaluation  History of atypical chest pain, palpitations, migraine   Additional history obtained:  Additional history obtained from visitor bedside    Lab Tests:  I Ordered, and personally interpreted labs.  The pertinent results include: CBC, BMP, urinalysis, and capillary blood glucose unremarkable  Cardiac Monitoring: / EKG:  The patient was maintained on a cardiac monitor.  I personally viewed and interpreted the cardiac  monitored which showed an underlying rhythm of: Sinus rhythm   Problem List / ED Course / Critical interventions / Medication management   I ordered medication including normal saline bolus for fluid resuscitation Reevaluation of the patient after these medicines showed that the patient improved I have reviewed the patients home medicines and have made adjustments as needed   Test / Admission - Considered:  The patient is feeling much better after fluid administration.  No dysrhythmia was noted on EKG.  No neurodeficits to suggest CVA.  No headache to suggest intracranial pathology.  I feel this is likely due to dehydration.  The patient admits to drinking only 1 or 2 bottles of water daily and drink a large amount of alcohol over the past week.  Patient advised to increase water consumption.  Patient plans to discuss recent episodes with his primary care provider at upcoming physical appointment.  This seems perfectly reasonable.  I see no indication for admission at this time.  Patient to discharge home.        Final Clinical Impression(s) / ED Diagnoses Final diagnoses:  Lightheadedness  Dehydration    Rx / DC Orders ED Discharge Orders     None         Pamala Duffel 10/17/22 0015    Tegeler, Canary Brim, MD 10/17/22 1525

## 2022-10-16 NOTE — ED Triage Notes (Signed)
Reports on Saturday went back to school for homecoming. Reports was drinking and got dizzy and felt like he was going to pass out. Reports was given two tootsie rolls.  Then again today started feeling dizzy and took a shower and felt worse.   +nausea.  No headache.

## 2022-10-16 NOTE — ED Notes (Signed)
Pt ambulatory to bathroom, no assistance needed.  

## 2022-10-17 NOTE — Discharge Instructions (Signed)
You were seen today for lightheadedness.  This may be due to dehydration.  Your EKG was unremarkable.  Your lab work was also unremarkable today's visit.  Please discuss this recent episode with your primary care provider at your upcoming physical.  If you develop any life-threatening conditions please return to the emergency department for reevaluation
